# Patient Record
Sex: Female | Born: 1990 | Race: Black or African American | Hispanic: No | Marital: Single | Smoking: Former smoker
Health system: Southern US, Community
[De-identification: ages and names within clinical notes are randomized; demographics above are authoritative.]

## PROBLEM LIST (undated history)

## (undated) ENCOUNTER — Inpatient Hospital Stay (HOSPITAL_COMMUNITY): Payer: Self-pay

## (undated) DIAGNOSIS — R519 Headache, unspecified: Secondary | ICD-10-CM

## (undated) DIAGNOSIS — R4584 Anhedonia: Secondary | ICD-10-CM

## (undated) DIAGNOSIS — J302 Other seasonal allergic rhinitis: Secondary | ICD-10-CM

## (undated) DIAGNOSIS — G40909 Epilepsy, unspecified, not intractable, without status epilepticus: Secondary | ICD-10-CM

## (undated) DIAGNOSIS — F32A Depression, unspecified: Secondary | ICD-10-CM

## (undated) DIAGNOSIS — D649 Anemia, unspecified: Secondary | ICD-10-CM

## (undated) DIAGNOSIS — M419 Scoliosis, unspecified: Secondary | ICD-10-CM

## (undated) HISTORY — PX: OTHER SURGICAL HISTORY: SHX169

---
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
## 2009-01-25 ENCOUNTER — Emergency Department (HOSPITAL_COMMUNITY): Admission: EM | Admit: 2009-01-25 | Discharge: 2009-01-25 | Payer: Self-pay | Admitting: Emergency Medicine

## 2009-02-18 ENCOUNTER — Emergency Department (HOSPITAL_COMMUNITY): Admission: EM | Admit: 2009-02-18 | Discharge: 2009-02-18 | Payer: Self-pay | Admitting: Emergency Medicine

## 2009-06-15 ENCOUNTER — Ambulatory Visit: Payer: Self-pay | Admitting: Pediatrics

## 2009-06-19 ENCOUNTER — Emergency Department (HOSPITAL_COMMUNITY): Admission: EM | Admit: 2009-06-19 | Discharge: 2009-06-19 | Payer: Self-pay | Admitting: Emergency Medicine

## 2009-07-02 ENCOUNTER — Emergency Department (HOSPITAL_COMMUNITY): Admission: EM | Admit: 2009-07-02 | Discharge: 2009-07-02 | Payer: Self-pay | Admitting: Family Medicine

## 2009-08-03 ENCOUNTER — Emergency Department (HOSPITAL_COMMUNITY): Admission: EM | Admit: 2009-08-03 | Discharge: 2009-08-03 | Payer: Self-pay | Admitting: Emergency Medicine

## 2009-08-31 ENCOUNTER — Emergency Department (HOSPITAL_COMMUNITY): Admission: EM | Admit: 2009-08-31 | Discharge: 2009-08-31 | Payer: Self-pay | Admitting: Family Medicine

## 2009-09-15 ENCOUNTER — Ambulatory Visit: Payer: Self-pay | Admitting: Pediatrics

## 2009-11-10 ENCOUNTER — Emergency Department (HOSPITAL_COMMUNITY): Admission: EM | Admit: 2009-11-10 | Discharge: 2009-11-10 | Payer: Self-pay | Admitting: Family Medicine

## 2010-04-18 ENCOUNTER — Ambulatory Visit: Payer: Self-pay | Admitting: Pediatrics

## 2010-07-21 ENCOUNTER — Encounter: Admission: RE | Admit: 2010-07-21 | Discharge: 2010-07-21 | Payer: Self-pay | Admitting: Obstetrics and Gynecology

## 2011-01-21 ENCOUNTER — Encounter: Payer: Self-pay | Admitting: Pediatrics

## 2011-04-06 LAB — POCT RAPID STREP A (OFFICE): Streptococcus, Group A Screen (Direct): NEGATIVE

## 2011-04-07 LAB — URINALYSIS, ROUTINE W REFLEX MICROSCOPIC
Ketones, ur: NEGATIVE mg/dL
Leukocytes, UA: NEGATIVE
Nitrite: NEGATIVE
Protein, ur: 30 mg/dL — AB
pH: 5 (ref 5.0–8.0)

## 2011-04-07 LAB — DIFFERENTIAL
Basophils Absolute: 0 10*3/uL (ref 0.0–0.1)
Lymphocytes Relative: 20 % (ref 12–46)
Neutro Abs: 8 10*3/uL — ABNORMAL HIGH (ref 1.7–7.7)

## 2011-04-07 LAB — BASIC METABOLIC PANEL
BUN: 4 mg/dL — ABNORMAL LOW (ref 6–23)
Calcium: 8.5 mg/dL (ref 8.4–10.5)
GFR calc non Af Amer: >60 mL/min (ref >60)
Glucose, Bld: 95 mg/dL (ref 70–99)
Sodium: 136 mEq/L (ref 135–145)

## 2011-04-07 LAB — CBC
Platelets: 232 10*3/uL (ref 150–400)
RDW: 13.2 % (ref 11.5–15.5)

## 2011-04-08 LAB — POCT RAPID STREP A (OFFICE): Streptococcus, Group A Screen (Direct): NEGATIVE

## 2011-04-09 LAB — DIFFERENTIAL
Basophils Relative: 0 % (ref 0–1)
Lymphocytes Relative: 16 % (ref 12–46)
Lymphs Abs: 1.8 10*3/uL (ref 0.7–4.0)
Monocytes Absolute: 0.7 10*3/uL (ref 0.1–1.0)
Monocytes Relative: 6 % (ref 3–12)
Neutro Abs: 9 10*3/uL — ABNORMAL HIGH (ref 1.7–7.7)

## 2011-04-09 LAB — URINE MICROSCOPIC-ADD ON

## 2011-04-09 LAB — BASIC METABOLIC PANEL
CO2: 26 mEq/L (ref 19–32)
Calcium: 8.5 mg/dL (ref 8.4–10.5)
Chloride: 106 mEq/L (ref 96–112)
GFR calc Af Amer: >60 mL/min (ref >60)
Sodium: 137 mEq/L (ref 135–145)

## 2011-04-09 LAB — CBC
Hemoglobin: 13 g/dL (ref 12.0–15.0)
MCHC: 34 g/dL (ref 30.0–36.0)
RBC: 4.25 MIL/uL (ref 3.87–5.11)
WBC: 11.5 10*3/uL — ABNORMAL HIGH (ref 4.0–10.5)

## 2011-04-09 LAB — URINALYSIS, ROUTINE W REFLEX MICROSCOPIC
Nitrite: NEGATIVE
Specific Gravity, Urine: 1.022 (ref 1.005–1.030)
Urobilinogen, UA: 1 mg/dL (ref 0.0–1.0)

## 2011-04-09 LAB — URINE CULTURE: Colony Count: 70000

## 2011-05-18 NOTE — Procedures (Signed)
EEG NUMBER:  [id number]   CLINICAL HISTORY:  The patient is a 20 year old who has had three episodes  of seizure-like activity, the first at 20 years of age, the second at 71, the  last two weeks ago during sleep.  She had been her tongue bilaterally and  had saliva coming from her mouth.  She has a history of attention deficit  disorder (780.39).   PROCEDURE:  The tracing was carried out on a 32-channel digital Cadwell  recorder reformatted into 16 channel montages with one devoted to EKG.  The  patient was awake and drowsy during the recording.  The International 10-20  system of lead placement used.  Medications include and Naprosyn and  Adderall.   DESCRIPTION OF FINDINGS:  The dominant frequency is an 8-9 Hz, 25-35  microvolt alpha range activity that attenuates partially with eye opening.   Background activity is a mixture of alpha and beta range components with  frontally predominant, under 10 microvolt beta range activity.   The patient becomes drowsy with mixed frequency theta and upper delta range  activity.  Intermittent photic stimulation induced a driving response  between 5 and 15 Hz.  Hyperventilation caused no change.  There was no  interictal epileptiform activity in the form of spikes or sharp waves.   EKG showed regular sinus rhythm with ventricular response of 72 beats per  minute.   IMPRESSION:  Normal record with the patient awake and drowsy.      Karina Nelson. Sharene Skeans, M.D.  Electronically Signed     UKG:URKY  D:  08/23/2006 11:28:05  T:  08/23/2006 14:22:40  Job #:  [id number]

## 2011-05-18 NOTE — Procedures (Signed)
EEG Number [id number].   HISTORY:  This is a 20 year old with history of seizures who is having EEG  done to evaluate for seizure activity.   PROCEDURE:  This is a sleep-deprived EEG.   TECHNICAL DESCRIPTION:  Throughout this sleep-deprived EEG, there is a  posterior dominant rhythm of 10 Hz activity at 20-30 microvolts. Background  activity is symmetric mostly comprised of alpha range activity at 15-35  microvolts. Occasionally in the background there is EMG movement artifact  that occasionally obscures the background. With photic stimulation, there is  symmetric photic driving response.  Hyperventilation does not produce any  significant abnormalities.  The patient does become drowsy and eventually  falls into stage II sleep with the appearance of symmetric vertex waves and  sleep spindles. At one time during the tracing when the patient is drowsy  and sleeping, there is an isolated bilateral frontal sharply contoured slow  wave which is of uncertain significance   IMPRESSION:  This sleep-deprived EEG is essentially within normal limits in  the awake and sleep states. There is an isolated prefrontal sharply  contoured slow wave bilaterally which is of uncertain significance.  We will  consider repeating this study if seizures are of high concern.  Clinical  correlation is advised.   CC: Dr Crista Curb R. Nash Shearer, M.D.  Electronically Signed     FAO:ZHYQ  D:  10/14/2006 09:49:30  T:  10/14/2006 19:00:40  Job #:  [id number]

## 2011-06-05 ENCOUNTER — Ambulatory Visit (HOSPITAL_COMMUNITY)
Admission: RE | Admit: 2011-06-05 | Discharge: 2011-06-05 | Disposition: A | Discharge status: Home / Self Care | Payer: BC Managed Care – PPO | Source: Ambulatory Visit | Attending: Pediatrics | Admitting: Pediatrics

## 2011-06-05 DIAGNOSIS — G40309 Generalized idiopathic epilepsy and epileptic syndromes, not intractable, without status epilepticus: Secondary | ICD-10-CM | POA: Insufficient documentation

## 2011-06-05 NOTE — Procedures (Signed)
EEG NUMBER:  [id number].  CLINICAL HISTORY:  The patient is a 20 year old with seizures that began at age 27.  They recurred at 43-52 years of age.  After treatment with Lamictal, she has been seizure free.  Her last EEG in August 2007, was a normal record with the patient awake and drowsy.  Study is being done to look for presence of seizures (345.10).  PROCEDURE:  Tracing is carried out on a 32-channel digital Cadwell recorder, reformatted into 16-channel montages with one devoted to EKG. The patient was awake and drowsy during the recording.  The international 10/20 system lead placement was used.  She takes Lamictal. Recording time was 23 minutes.  DESCRIPTION OF FINDINGS:  Dominant frequency is a 9 Hz 25-30 microvolt activity that is well-regulated and attenuates partially with eye opening.  Background activity consists of mixed frequency alpha and low- voltage beta range activity.  Toward the end of background shows mixed frequency theta range activity that is broadly distributed.  Light natural sleep was not achieved.  Prior to that activating procedures with intermittent photic stimulation induced a driving response from 7-84 Hz.  Hyperventilation caused no significant change.  There was no interictal epileptiform activity in the form of spikes or sharp waves.  EKG showed a sinus rhythm with ventricular response of 84 beats per minute.  IMPRESSION:  Normal record with the patient awake and drowsy.     Deanna Artis. Sharene Skeans, M.D. Electronically Signed    ONG:EXBM D:  06/05/2011 11:13:19  T:  06/05/2011 23:05:13  Job #:  [id number]

## 2011-09-19 LAB — BASIC METABOLIC PANEL
BUN: 5 — ABNORMAL LOW
CO2: 27
Chloride: 105
Glucose, Bld: 91
Potassium: 4.1

## 2011-09-27 LAB — POCT I-STAT, CHEM 8
BUN: 7
Calcium, Ion: 1.21
Chloride: 105
Creatinine, Ser: 0.9
Glucose, Bld: 82
TCO2: 25

## 2011-09-28 LAB — POCT I-STAT, CHEM 8
BUN: <3 — ABNORMAL LOW
Creatinine, Ser: 0.9
Glucose, Bld: 87
Hemoglobin: 12.6
TCO2: 22

## 2011-10-08 LAB — LEVETIRACETAM LEVEL: Levetiracetam Lvl: 19.6 ug/mL

## 2011-10-11 LAB — COMPREHENSIVE METABOLIC PANEL
AST: 26
Albumin: 3.7
Alkaline Phosphatase: 68
Chloride: 107
Creatinine, Ser: 0.67
Potassium: 4.6
Total Bilirubin: 1.1

## 2011-10-11 LAB — I-STAT 8, (EC8 V) (CONVERTED LAB)
BUN: 4 — ABNORMAL LOW
Chloride: 106
Glucose, Bld: 87
pCO2, Ven: 41 — ABNORMAL LOW
pH, Ven: 7.37 — ABNORMAL HIGH

## 2011-10-11 LAB — DIFFERENTIAL
Basophils Absolute: 0.1
Lymphs Abs: 1.7
Monocytes Relative: 6
Neutro Abs: 4.5

## 2011-10-11 LAB — URINALYSIS, ROUTINE W REFLEX MICROSCOPIC
Bilirubin Urine: NEGATIVE
Glucose, UA: NEGATIVE
Ketones, ur: 15 — AB
Nitrite: NEGATIVE
pH: 5.5

## 2011-10-11 LAB — POCT I-STAT CREATININE
Creatinine, Ser: 0.6
Operator id: 294521

## 2011-10-11 LAB — RAPID URINE DRUG SCREEN, HOSP PERFORMED
Barbiturates: NOT DETECTED
Cocaine: NOT DETECTED
Opiates: NOT DETECTED

## 2011-10-11 LAB — URINE MICROSCOPIC-ADD ON

## 2011-10-11 LAB — CBC
Platelets: 195
WBC: 6.8

## 2011-10-12 LAB — I-STAT 8, (EC8 V) (CONVERTED LAB)
Acid-base deficit: 2
Chloride: 104
HCT: 35 — ABNORMAL LOW
Operator id: 257131
TCO2: 24
pCO2, Ven: 38.9 — ABNORMAL LOW
pH, Ven: 7.373 — ABNORMAL HIGH

## 2011-10-12 LAB — URINALYSIS, ROUTINE W REFLEX MICROSCOPIC
Bilirubin Urine: NEGATIVE
Hgb urine dipstick: NEGATIVE
Nitrite: NEGATIVE
Specific Gravity, Urine: 1.019
Urobilinogen, UA: 0.2
pH: 5.5

## 2011-10-12 LAB — POCT PREGNANCY, URINE
Operator id: 257131
Preg Test, Ur: NEGATIVE

## 2011-10-12 LAB — DIFFERENTIAL
Basophils Absolute: 0
Lymphocytes Relative: 29
Monocytes Absolute: 0.6
Monocytes Relative: 9
Neutro Abs: 3.8
Neutrophils Relative %: 59

## 2011-10-12 LAB — POCT I-STAT CREATININE: Operator id: 257131

## 2011-10-12 LAB — CBC
Hemoglobin: 10.8 — ABNORMAL LOW
RBC: 3.86

## 2011-10-15 LAB — RAPID STREP SCREEN (MED CTR MEBANE ONLY): Streptococcus, Group A Screen (Direct): NEGATIVE

## 2011-11-29 DIAGNOSIS — M79609 Pain in unspecified limb: Secondary | ICD-10-CM | POA: Insufficient documentation

## 2011-11-29 DIAGNOSIS — Z79899 Other long term (current) drug therapy: Secondary | ICD-10-CM | POA: Insufficient documentation

## 2011-11-29 DIAGNOSIS — M7989 Other specified soft tissue disorders: Secondary | ICD-10-CM | POA: Insufficient documentation

## 2011-11-29 DIAGNOSIS — G40909 Epilepsy, unspecified, not intractable, without status epilepticus: Secondary | ICD-10-CM | POA: Insufficient documentation

## 2011-11-29 DIAGNOSIS — J45909 Unspecified asthma, uncomplicated: Secondary | ICD-10-CM | POA: Insufficient documentation

## 2011-11-30 ENCOUNTER — Emergency Department (HOSPITAL_COMMUNITY)
Admission: EM | Admit: 2011-11-30 | Discharge: 2011-11-30 | Disposition: A | Discharge status: Home / Self Care | Payer: BC Managed Care – PPO | Attending: Emergency Medicine | Admitting: Emergency Medicine

## 2011-11-30 ENCOUNTER — Encounter: Payer: Self-pay | Admitting: Emergency Medicine

## 2011-11-30 DIAGNOSIS — M79673 Pain in unspecified foot: Secondary | ICD-10-CM

## 2011-11-30 HISTORY — DX: Other seasonal allergic rhinitis: J30.2

## 2011-11-30 HISTORY — DX: Epilepsy, unspecified, not intractable, without status epilepticus: G40.909

## 2011-11-30 HISTORY — DX: Anemia, unspecified: D64.9

## 2011-11-30 MED ORDER — IBUPROFEN 800 MG PO TABS: 800.0000 mg | ORAL_TABLET | Freq: Three times a day (TID) | ORAL | Status: AC

## 2011-11-30 MED ORDER — HYDROCODONE-ACETAMINOPHEN 5-325 MG PO TABS: ORAL_TABLET | ORAL | Status: DC

## 2011-11-30 NOTE — ED Notes (Signed)
PT. REPORTS BILATERAL FOOT PAIN FOR 3 DAYS , DENIES FALL OR INJURY . AMBULATORY.

## 2011-11-30 NOTE — ED Provider Notes (Signed)
History     CSN: [id number] Arrival date & time: 11/30/2011 12:30 AM   First MD Initiated Contact with Patient 11/30/11 0142      Chief Complaint  Patient presents with  . Foot Pain    (Consider location/radiation/quality/duration/timing/severity/associated sxs/prior treatment) Patient is a 20 y.o. female presenting with lower extremity pain. The history is provided by the patient.  Foot Pain This is a new problem. The current episode started in the past 7 days. The problem occurs daily. The problem has been unchanged. Pertinent negatives include no chills or fever. Associated symptoms comments: Pain in both feet originating at the dorsolateral proximal forefoot and extending into great toe. No injury. Mild swelling. . Exacerbated by: She has been at a new job where she is on her feet all shift for 3 weeks. She has attempted to change shoes without improvement.    Past Medical History  Diagnosis Date  . Asthma   . Epilepsy   . Seasonal allergies   . Anemia     History reviewed. No pertinent past surgical history.  No family history on file.  History  Substance Use Topics  . Smoking status: Never Smoker   . Smokeless tobacco: Not on file  . Alcohol Use: No    OB History    Grav Para Term Preterm Abortions TAB SAB Ect Mult Living                  Review of Systems  Constitutional: Negative for fever and chills.  HENT: Negative.   Respiratory: Negative.   Cardiovascular: Negative.   Gastrointestinal: Negative.   Musculoskeletal:       See HPI.  Skin: Negative.   Neurological: Negative.     Allergies  Review of patient's allergies indicates no known allergies.  Home Medications   Current Outpatient Rx  Name Route Sig Dispense Refill  . LAMOTRIGINE 150 MG PO TABS Oral Take 150 mg by mouth daily.      Marland Kitchen HYDROCODONE-ACETAMINOPHEN 5-325 MG PO TABS  1-2 tabs q 4-6 hours prn 6 tablet 0  . IBUPROFEN 800 MG PO TABS Oral Take 1 tablet (800 mg total) by mouth 3  (three) times daily. 21 tablet 0    BP 122/67  Pulse 86  Temp(Src) 98.5 F (36.9 C) (Oral)  Resp 18  SpO2 98%  LMP 11/15/2011  Physical Exam  Constitutional: She is oriented to person, place, and time. She appears well-developed and well-nourished.  Neck: Normal range of motion.  Pulmonary/Chest: Effort normal.  Musculoskeletal:       Minimal swelling to feet without discoloration, deformity or redness/warmth. FROM. Minimal tenderness. No plantar tenderness.   Neurological: She is alert and oriented to person, place, and time.  Skin: Skin is warm and dry.    ED Course  Procedures (including critical care time)  Labs Reviewed - No data to display No results found.   1. Foot pain       MDM          Rodena Medin, PA 11/30/11 [phone]

## 2011-11-30 NOTE — ED Notes (Signed)
Pt to ED for eval of foot pain; pt reports that she is having pain in bil feet; pt denies injury; to R foot; pt has swelling to lateral side a/w pain; pt c/o sharp pain to L foot

## 2011-11-30 NOTE — ED Provider Notes (Signed)
Medical screening examination/treatment/procedure(s) were performed by non-physician practitioner and as supervising physician I was immediately available for consultation/collaboration.   Lyanne Co, MD 11/30/11 [phone]

## 2011-12-11 ENCOUNTER — Emergency Department (HOSPITAL_COMMUNITY)
Admission: EM | Admit: 2011-12-11 | Discharge: 2011-12-11 | Disposition: A | Discharge status: Home / Self Care | Payer: BC Managed Care – PPO | Attending: Emergency Medicine | Admitting: Emergency Medicine

## 2011-12-11 ENCOUNTER — Encounter (HOSPITAL_COMMUNITY): Payer: Self-pay

## 2011-12-11 DIAGNOSIS — Z9119 Patient's noncompliance with other medical treatment and regimen: Secondary | ICD-10-CM | POA: Insufficient documentation

## 2011-12-11 DIAGNOSIS — R569 Unspecified convulsions: Secondary | ICD-10-CM

## 2011-12-11 DIAGNOSIS — G40909 Epilepsy, unspecified, not intractable, without status epilepticus: Secondary | ICD-10-CM | POA: Insufficient documentation

## 2011-12-11 DIAGNOSIS — Z9114 Patient's other noncompliance with medication regimen: Secondary | ICD-10-CM

## 2011-12-11 DIAGNOSIS — Z91199 Patient's noncompliance with other medical treatment and regimen due to unspecified reason: Secondary | ICD-10-CM | POA: Insufficient documentation

## 2011-12-11 DIAGNOSIS — J45909 Unspecified asthma, uncomplicated: Secondary | ICD-10-CM | POA: Insufficient documentation

## 2011-12-11 LAB — POCT I-STAT, CHEM 8
BUN: 4 mg/dL — ABNORMAL LOW (ref 6–23)
Chloride: 104 mEq/L (ref 96–112)
Creatinine, Ser: 0.8 mg/dL (ref 0.50–1.10)
Glucose, Bld: 103 mg/dL — ABNORMAL HIGH (ref 70–99)
Potassium: 3.6 mEq/L (ref 3.5–5.1)
Sodium: 141 mEq/L (ref 135–145)

## 2011-12-11 MED ORDER — LAMOTRIGINE 200 MG PO TABS
200.0000 mg | ORAL_TABLET | Freq: Once | ORAL | Status: AC
  Administered 2011-12-11: 200 mg via ORAL
  Filled 2011-12-11: qty 1

## 2011-12-11 MED ORDER — LAMOTRIGINE 100 MG PO TABS: ORAL_TABLET | ORAL | Status: DC

## 2011-12-11 MED ORDER — ACETAMINOPHEN 325 MG PO TABS
650.0000 mg | ORAL_TABLET | Freq: Once | ORAL | Status: DC
  Filled 2011-12-11: qty 2

## 2011-12-11 MED ORDER — SODIUM CHLORIDE 0.9 % IV BOLUS (SEPSIS): 500.0000 mL | Freq: Once | INTRAVENOUS | Status: DC

## 2011-12-11 NOTE — ED Provider Notes (Signed)
History     CSN: [id number] Arrival date & time: 12/11/2011 12:14 PM   First MD Initiated Contact with Patient 12/11/11 1219      Chief Complaint  Patient presents with  . Seizures    (Consider location/radiation/quality/duration/timing/severity/associated sxs/prior treatment) Patient is a 20 y.o. female presenting with seizures. The history is provided by the patient.  Seizures  This is a recurrent problem. The current episode started 1 to 2 hours ago. Pertinent negatives include no headaches, no chest pain, no nausea, no vomiting and no diarrhea.   patient had a tonic-clonic seizure today. She's a history of seizures. She's been taking less of her medications because she is about to run out. She also has had a flulike symptoms last week. It has resolved. She also is a new job she states keeps her up at night. She's also had a lot of stress in her life recently. No injury due to the seizure. She's also stopped her birth control pills. She states there were $50 and her insurance is changed.in   Past Medical History  Diagnosis Date  . Asthma   . Epilepsy   . Seasonal allergies   . Anemia     No past surgical history on file.  No family history on file.  History  Substance Use Topics  . Smoking status: Never Smoker   . Smokeless tobacco: Not on file  . Alcohol Use: No    OB History    Grav Para Term Preterm Abortions TAB SAB Ect Mult Living                  Review of Systems  Constitutional: Negative for activity change and appetite change.  HENT: Negative for neck stiffness.   Eyes: Negative for pain.  Respiratory: Negative for chest tightness and shortness of breath.   Cardiovascular: Negative for chest pain and leg swelling.  Gastrointestinal: Negative for nausea, vomiting, abdominal pain and diarrhea.  Genitourinary: Negative for flank pain.  Musculoskeletal: Negative for back pain.  Skin: Negative for rash.  Neurological: Positive for seizures. Negative for  weakness, numbness and headaches.  Psychiatric/Behavioral: Negative for behavioral problems.    Allergies  Celery oil; Rice; Strawberry; and Sudafed  Home Medications   Current Outpatient Rx  Name Route Sig Dispense Refill  . IBUPROFEN 200 MG PO TABS Oral Take 400 mg by mouth every 6 (six) hours as needed. For pain     . LAMOTRIGINE 100 MG PO TABS Oral Take 150-200 mg by mouth as directed. Takes 150mg  in the morning and 200mg  at night     . IBUPROFEN 800 MG PO TABS Oral Take 1 tablet (800 mg total) by mouth 3 (three) times daily. 21 tablet 0  . LAMOTRIGINE 100 MG PO TABS  150 mg in morning and 200 mg at night. 50 tablet 0    BP 119/64  Pulse 107  Temp(Src) 98.6 F (37 C) (Oral)  Resp 16  SpO2 99%  LMP 11/15/2011  Physical Exam  Nursing note and vitals reviewed. Constitutional: She is oriented to person, place, and time. She appears well-developed and well-nourished.       obese  HENT:  Head: Normocephalic and atraumatic.  Eyes: EOM are normal. Pupils are equal, round, and reactive to light.  Neck: Normal range of motion. Neck supple.  Cardiovascular: Normal rate, regular rhythm and normal heart sounds.   No murmur heard. Pulmonary/Chest: Effort normal and breath sounds normal. No respiratory distress. She has no wheezes. She has  no rales.  Abdominal: Soft. Bowel sounds are normal. She exhibits no distension. There is no tenderness. There is no rebound and no guarding.  Musculoskeletal: Normal range of motion.  Neurological: She is alert and oriented to person, place, and time. No cranial nerve deficit.  Skin: Skin is warm and dry.  Psychiatric: She has a normal mood and affect. Her speech is normal.    ED Course  Procedures (including critical care time)  Labs Reviewed  POCT I-STAT, CHEM 8 - Abnormal; Notable for the following:    BUN 4 (*)    Glucose, Bld 103 (*)    All other components within normal limits  POCT PREGNANCY, URINE  I-STAT, CHEM 8  POCT PREGNANCY,  URINE   No results found.   1. Seizure   2. Noncompliance with medications       MDM  Seizure with history of same. She has not been taking all her medications she states she was running out. She has been taking less than her full dose. She has typical tonic-clonic seizure. She is back to baseline. She's not pregnant. She was written for more medication and will follow with her doctors        Juliet Rude. Rubin Payor, MD 12/11/11 1442

## 2011-12-11 NOTE — ED Notes (Signed)
134/84.post-icthal on arrival..The patient has a history of of sz.the patient has cut her med dose.

## 2011-12-11 NOTE — ED Notes (Signed)
Went to inform patient that I need a urine sample and she stated that she had use the bathroom already

## 2011-12-11 NOTE — ED Notes (Signed)
ZOX:[id number]<AV> Expected date:12/11/11<BR> Expected time:11:59 AM<BR> Means of arrival:Ambulance<BR> Comments:<BR> EMS 60 GC, 19 yof seizure w hx

## 2012-02-05 ENCOUNTER — Emergency Department (INDEPENDENT_AMBULATORY_CARE_PROVIDER_SITE_OTHER)
Admission: EM | Admit: 2012-02-05 | Discharge: 2012-02-05 | Disposition: A | Discharge status: Home / Self Care | Payer: BC Managed Care – PPO | Source: Home / Self Care | Attending: Family Medicine | Admitting: Family Medicine

## 2012-02-05 ENCOUNTER — Encounter (HOSPITAL_COMMUNITY): Payer: Self-pay | Admitting: Emergency Medicine

## 2012-02-05 DIAGNOSIS — J069 Acute upper respiratory infection, unspecified: Secondary | ICD-10-CM

## 2012-02-05 DIAGNOSIS — J029 Acute pharyngitis, unspecified: Secondary | ICD-10-CM

## 2012-02-05 MED ORDER — AMOXICILLIN 500 MG PO CAPS: 500.0000 mg | ORAL_CAPSULE | Freq: Three times a day (TID) | ORAL | Status: AC

## 2012-02-05 NOTE — ED Provider Notes (Signed)
History     CSN: [id number]  Arrival date & time 02/05/12  1825   First MD Initiated Contact with Patient 02/05/12 2008      Chief Complaint  Patient presents with  . Sore Throat    (Consider location/radiation/quality/duration/timing/severity/associated sxs/prior treatment) HPI Comments: In the presents for evaluation of sore throat, sneezing, and nasal congestion that started yesterday. She denies any fever, but reports cold, chills and body aches. She's been taking vitamins, especially vitamin C, Zyrtec, and Mucinex without relief. She reports a history of allergies, but no history of asthma. She reports that the sore throat is the worst symptom and she has difficulty swallowing.  Patient is a 21 y.o. female presenting with pharyngitis. The history is provided by the patient.  Sore Throat This is a new problem. The problem occurs constantly. The problem has not changed since onset.The symptoms are aggravated by swallowing and eating. She has tried nothing for the symptoms.    Past Medical History  Diagnosis Date  . Asthma   . Epilepsy   . Seasonal allergies   . Anemia     No past surgical history on file.  No family history on file.  History  Substance Use Topics  . Smoking status: Never Smoker   . Smokeless tobacco: Not on file  . Alcohol Use: No    OB History    Grav Para Term Preterm Abortions TAB SAB Ect Mult Living                  Review of Systems  Constitutional: Positive for chills.  HENT: Positive for congestion and sore throat.   Eyes: Negative.   Respiratory: Negative.  Negative for cough.   Cardiovascular: Negative.   Gastrointestinal: Negative.   Genitourinary: Negative.   Musculoskeletal: Negative.   Skin: Negative.   Neurological: Negative.     Allergies  Celery oil; Rice; Strawberry; and Sudafed  Home Medications   Current Outpatient Rx  Name Route Sig Dispense Refill  . CETIRIZINE HCL 10 MG PO TABS Oral Take 10 mg by mouth daily.     . AMOXICILLIN 500 MG PO CAPS Oral Take 1 capsule (500 mg total) by mouth 3 (three) times daily. 30 capsule 0  . IBUPROFEN 200 MG PO TABS Oral Take 400 mg by mouth every 6 (six) hours as needed. For pain     . LAMOTRIGINE 100 MG PO TABS Oral Take 150-200 mg by mouth as directed. Takes 150mg  in the morning and 200mg  at night     . LAMOTRIGINE 100 MG PO TABS  150 mg in morning and 200 mg at night. 50 tablet 0    BP 126/83  Pulse 86  Temp(Src) 98.2 F (36.8 C) (Oral)  Resp 17  SpO2 98%  Physical Exam  Nursing note and vitals reviewed. Constitutional: She is oriented to person, place, and time. She appears well-developed and well-nourished.  HENT:  Head: Normocephalic and atraumatic.  Right Ear: Tympanic membrane normal.  Left Ear: Tympanic membrane normal.  Mouth/Throat: No oropharyngeal exudate.    Eyes: EOM are normal.  Neck: Normal range of motion.  Pulmonary/Chest: Effort normal and breath sounds normal. She has no wheezes. She has no rhonchi.  Musculoskeletal: Normal range of motion.  Neurological: She is alert and oriented to person, place, and time.  Skin: Skin is warm and dry.  Psychiatric: Her behavior is normal.    ED Course  Procedures (including critical care time)  Labs Reviewed - No data to  display No results found.   1. URI (upper respiratory infection)   2. Pharyngitis       MDM  Likely viral pharyngitis; given delayed rx for amoxicillin        Richardo Priest, MD 02/05/12 2048

## 2012-02-05 NOTE — ED Notes (Signed)
Pt. Stated, I've had a sore throat and achiness since yesterday.

## 2013-04-17 ENCOUNTER — Other Ambulatory Visit: Payer: Self-pay

## 2013-04-17 DIAGNOSIS — G40309 Generalized idiopathic epilepsy and epileptic syndromes, not intractable, without status epilepticus: Secondary | ICD-10-CM

## 2013-04-17 MED ORDER — LAMOTRIGINE 100 MG PO TABS: ORAL_TABLET | ORAL | Status: DC

## 2013-05-15 ENCOUNTER — Other Ambulatory Visit: Payer: Self-pay

## 2013-05-15 DIAGNOSIS — G40309 Generalized idiopathic epilepsy and epileptic syndromes, not intractable, without status epilepticus: Secondary | ICD-10-CM

## 2013-05-15 MED ORDER — LAMOTRIGINE 100 MG PO TABS: ORAL_TABLET | ORAL | Status: DC

## 2013-05-15 NOTE — Telephone Encounter (Signed)
Beverly lvm stating that pt needed a refill and also wanted to schedule f/u visit. I called mom back and reached her vm. I lvm letting her know that i was calling to schedule appt and that we would refill the medication this once but that she needed to call back for appt.

## 2013-06-19 ENCOUNTER — Other Ambulatory Visit: Payer: Self-pay

## 2013-06-19 DIAGNOSIS — G40309 Generalized idiopathic epilepsy and epileptic syndromes, not intractable, without status epilepticus: Secondary | ICD-10-CM

## 2013-06-19 MED ORDER — LAMOTRIGINE 100 MG PO TABS: ORAL_TABLET | ORAL | Status: DC

## 2013-06-19 NOTE — Telephone Encounter (Signed)
[place] asked that refills be sent to [facility]. I called and let her know it will be done today.

## 2013-07-08 ENCOUNTER — Encounter: Payer: Self-pay | Admitting: Family

## 2013-07-08 DIAGNOSIS — G43009 Migraine without aura, not intractable, without status migrainosus: Secondary | ICD-10-CM

## 2013-07-08 DIAGNOSIS — F909 Attention-deficit hyperactivity disorder, unspecified type: Secondary | ICD-10-CM | POA: Insufficient documentation

## 2013-07-08 DIAGNOSIS — R209 Unspecified disturbances of skin sensation: Secondary | ICD-10-CM

## 2013-07-08 DIAGNOSIS — Z79899 Other long term (current) drug therapy: Secondary | ICD-10-CM | POA: Insufficient documentation

## 2013-07-08 DIAGNOSIS — G40309 Generalized idiopathic epilepsy and epileptic syndromes, not intractable, without status epilepticus: Secondary | ICD-10-CM

## 2013-07-09 ENCOUNTER — Encounter: Payer: Self-pay | Admitting: Family

## 2013-07-09 ENCOUNTER — Ambulatory Visit (INDEPENDENT_AMBULATORY_CARE_PROVIDER_SITE_OTHER): Payer: BC Managed Care – PPO | Admitting: Family

## 2013-07-09 VITALS — BP 126/82 | HR 80 | Ht 64.25 in | Wt 235.2 lb

## 2013-07-09 DIAGNOSIS — G43009 Migraine without aura, not intractable, without status migrainosus: Secondary | ICD-10-CM

## 2013-07-09 DIAGNOSIS — Z79899 Other long term (current) drug therapy: Secondary | ICD-10-CM

## 2013-07-09 DIAGNOSIS — F909 Attention-deficit hyperactivity disorder, unspecified type: Secondary | ICD-10-CM

## 2013-07-09 DIAGNOSIS — G40309 Generalized idiopathic epilepsy and epileptic syndromes, not intractable, without status epilepticus: Secondary | ICD-10-CM

## 2013-07-09 DIAGNOSIS — R209 Unspecified disturbances of skin sensation: Secondary | ICD-10-CM

## 2013-07-09 NOTE — Progress Notes (Signed)
Patient: Karina Nelson MRN: [id number] Sex: female DOB: [birth year]  Provider: Elveria Rising, NP Location of Care: [facility]  Note type: Routine return visit  History of Present Illness: Referral Source: Fleet Contras, MD History from: patient Chief Complaint: Seizures  Karina Nelson is a 22 y.o. 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  She is taking and tolerating Lamictal for her seizure disorder, which she obtains from a patient assistance program. Her last seizure occurred December 11, 2011 in the setting of missed medication and sleep deprivation. 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 She says that she doesn't like her academic major and is looking into going to the police academy. Karina has history of migraine headaches but she says that they have not been problematic. She has been healthy since last seen.   Review of Systems: 12 system review was remarkable for allergies  Past Medical History  Diagnosis Date  . Asthma   . Epilepsy   . Seasonal allergies   . Anemia    Hospitalizations: no, Head Injury: no, Nervous System Infections: no, Immunizations up to date: yes Past Medical History Comments: Significant for seizures.  She had a sleep study at [facility] in 2009.  The study was normal and was similar to that of 2004 when it was concluded that she had normal sleep architecture and had more of a problem with sleep hygiene.  It did not show any evidence of seizures.  Surgical History Surgeries: yes Surgical History Comments: Cyst removed behind left ear 10 years ago.  Family History There is epilepsy, diabetes, high blood pressure, heart disease, cancer and hemophilia in her family history.  Family History is negative for migraines, cognitive impairment, blindness, deafness, birth defects, chromosomal disorder, autism.  Social  History History   Social History  . Marital Status: Single    Spouse Name: N/A    Number of Children: N/A  . Years of Education: N/A   Social History Main Topics  . Smoking status: Never Smoker   . Smokeless tobacco: None  . Alcohol Use: Yes     Comment: Patient drinks alcohol occasionally on the weekend only.  . Drug Use: No  . Sexually Active: None   Other Topics Concern  . None   Social History Narrative  . None   Educational level: in college by taking some time off  Occupation: [employer] with mother and younger brother.   Current Outpatient Prescriptions on File Prior to Visit  Medication Sig Dispense Refill  . cetirizine (ZYRTEC) 10 MG tablet Take 10 mg by mouth daily.      Marland Kitchen ibuprofen (ADVIL,MOTRIN) 200 MG tablet Take 400 mg by mouth every 6 (six) hours as needed. For pain       . lamoTRIgine (LAMICTAL) 100 MG tablet Take 1+1/2 in the morning and take 2 at night  105 tablet  0   No current facility-administered medications on file prior to visit.   The medication list was reviewed and reconciled. All changes or newly prescribed medications were explained.  A complete medication list was provided to the patient/caregiver.  Allergies  Allergen Reactions  . Celery Oil Anaphylaxis  . Rice Anaphylaxis  . Strawberry Anaphylaxis  . Sudafed (Pseudoephedrine) Other (See Comments)    dizziness    Physical Exam BP 126/82  Pulse 80  Ht 5' 4.25" (1.632 m)  Wt 235 lb  3.2 oz (106.686 kg)  BMI 40.06 kg/m2 General: well developed, well nourished obese young woman, seated on exam table, in no evident distress Head: head  normocephalic and atraumatic.  Oropharynx benign. Ears, Nose and Throat: Oropharynx benign. Neck: supple with no carotid or supraclavicular bruits. Cardiovascular: regular rate and rhythm, no murmurs Musculoskeletal: no obvious deformities or scoliosis Skin: no rashes or lesions  Neurologic Exam  Mental Status: Awake and fully alert.   Oriented to place and time.  Recent and remote memory intact.  Attention span, concentration, and fund of knowledge appropriate.  Mood and affect appropriate. Cranial Nerves: Fundoscopic exam reveals sharp disc margins.  Pupils equal, briskly reactive to light.  Extraocular movements full without nystagmus.  Visual fields full to confrontation.  Hearing intact and symmetric to finger rub.  Facial sensation intact.  Face, tongue, palate move normally and symmetrically.  Neck flexion and extension normal. Motor: Normal bulk and tone.  Normal strength in all tested extremity muscles. Sensory: Intact to touch and temperature in all extremities. Coordination: Rapid alternating movements normal in all extremities.  Finger-to-nose and heel-to-shin performed accurately bilaterally. Romberg negative. Gait and Station: Arises from chair without difficulty.  Stance is normal.  Gait demonstrates normal stride length and balance.  Able to heel, toe, and tandem walk without difficulty. Reflexes: diminished and symmetric.  Toes downgoing.   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 She is taking and tolerating Lamictal and has been seizure free since December 2012. She will continue her medication without change. If Karina has not returned to school at her next follow up, I will help her to transition to adult neurology care.

## 2013-07-14 ENCOUNTER — Encounter: Payer: Self-pay | Admitting: Family

## 2013-07-14 NOTE — Patient Instructions (Signed)
Continue your medication without change.  Let me know if you have any seizures.  Plan to return for follow up in 6 months or sooner if needed.

## 2013-07-22 ENCOUNTER — Other Ambulatory Visit: Payer: Self-pay

## 2013-07-22 DIAGNOSIS — G40309 Generalized idiopathic epilepsy and epileptic syndromes, not intractable, without status epilepticus: Secondary | ICD-10-CM

## 2013-07-22 MED ORDER — LAMOTRIGINE 100 MG PO TABS: ORAL_TABLET | ORAL | Status: DC

## 2013-10-13 ENCOUNTER — Encounter (HOSPITAL_COMMUNITY): Payer: Self-pay | Admitting: Emergency Medicine

## 2013-10-13 ENCOUNTER — Emergency Department (HOSPITAL_COMMUNITY)
Admission: EM | Admit: 2013-10-13 | Discharge: 2013-10-13 | Disposition: A | Discharge status: Home / Self Care | Payer: BC Managed Care – PPO | Attending: Emergency Medicine | Admitting: Emergency Medicine

## 2013-10-13 DIAGNOSIS — J309 Allergic rhinitis, unspecified: Secondary | ICD-10-CM | POA: Insufficient documentation

## 2013-10-13 DIAGNOSIS — Z888 Allergy status to other drugs, medicaments and biological substances status: Secondary | ICD-10-CM | POA: Insufficient documentation

## 2013-10-13 DIAGNOSIS — J45909 Unspecified asthma, uncomplicated: Secondary | ICD-10-CM | POA: Insufficient documentation

## 2013-10-13 DIAGNOSIS — Z79899 Other long term (current) drug therapy: Secondary | ICD-10-CM | POA: Insufficient documentation

## 2013-10-13 DIAGNOSIS — J020 Streptococcal pharyngitis: Secondary | ICD-10-CM | POA: Insufficient documentation

## 2013-10-13 DIAGNOSIS — G40909 Epilepsy, unspecified, not intractable, without status epilepticus: Secondary | ICD-10-CM | POA: Insufficient documentation

## 2013-10-13 DIAGNOSIS — R1310 Dysphagia, unspecified: Secondary | ICD-10-CM | POA: Insufficient documentation

## 2013-10-13 DIAGNOSIS — D649 Anemia, unspecified: Secondary | ICD-10-CM | POA: Insufficient documentation

## 2013-10-13 MED ORDER — IBUPROFEN 800 MG PO TABS: 800.0000 mg | ORAL_TABLET | Freq: Three times a day (TID) | ORAL | Status: DC | PRN

## 2013-10-13 MED ORDER — PENICILLIN G BENZATHINE 1200000 UNIT/2ML IM SUSP
1.2000 10*6.[IU] | Freq: Once | INTRAMUSCULAR | Status: AC
  Administered 2013-10-13: 1.2 10*6.[IU] via INTRAMUSCULAR
  Filled 2013-10-13: qty 2

## 2013-10-13 MED ORDER — ACETAMINOPHEN-CODEINE 120-12 MG/5ML PO SOLN: 10.0000 mL | ORAL | Status: DC | PRN

## 2013-10-13 NOTE — ED Provider Notes (Signed)
CSN: [id number]     Arrival date & time 10/13/13  1044 History  This chart was scribed for non-physician practitioner, Ebbie Ridge, PA-C, working with Laray Anger, DO by Ronal Fear, ED scribe. This patient was seen in room TR11C/TR11C and the patient's care was started at 11:32 AM.     Chief Complaint  Patient presents with  . Sore Throat  . Allergic Reaction   Patient is a 22 y.o. female presenting with pharyngitis and allergic reaction. The history is provided by the patient. No language interpreter was used.  Sore Throat This is a new problem. The current episode started yesterday.  Allergic Reaction Presenting symptoms: difficulty swallowing   Severity:  Mild Relieved by:  None tried   Pt complains of gradual worsening sore throat and cough onset last night. Pt denies ear pain and rhinorrhea. Pt has a hx of seizures.  Past Medical History  Diagnosis Date  . Asthma   . Epilepsy   . Seasonal allergies   . Anemia    History reviewed. No pertinent past surgical history. History reviewed. No pertinent family history. History  Substance Use Topics  . Smoking status: Never Smoker   . Smokeless tobacco: Not on file  . Alcohol Use: Yes     Comment: Patient drinks alcohol occasionally on the weekend only.   OB History   Grav Para Term Preterm Abortions TAB SAB Ect Mult Living                 Review of Systems  HENT: Positive for sore throat and trouble swallowing. Negative for ear pain and rhinorrhea.   All other systems reviewed and are negative.    Allergies  Celery oil; Rice; Strawberry; and Sudafed  Home Medications   Current Outpatient Rx  Name  Route  Sig  Dispense  Refill  . cetirizine (ZYRTEC) 10 MG tablet   Oral   Take 10 mg by mouth daily.         Marland Kitchen ibuprofen (ADVIL,MOTRIN) 200 MG tablet   Oral   Take 400 mg by mouth every 6 (six) hours as needed. For pain          . lamoTRIgine (LAMICTAL) 100 MG tablet      Take 1+1/2 in the morning and  take 2 at night   105 tablet   5    BP 120/75  Pulse 90  Temp(Src) 98.3 F (36.8 C) (Oral)  Resp 18  Ht 5\' 4"  (1.626 m)  Wt 240 lb (108.863 kg)  BMI 41.18 kg/m2  SpO2 100% Physical Exam  Nursing note and vitals reviewed. Constitutional: She is oriented to person, place, and time. She appears well-developed and well-nourished. No distress.  HENT:  Head: Normocephalic and atraumatic.  Mouth/Throat: Posterior oropharyngeal erythema present.  Eyes: EOM are normal.  Neck: Neck supple. No tracheal deviation present.  Cardiovascular: Normal rate.   Pulmonary/Chest: Effort normal. No respiratory distress.  Musculoskeletal: Normal range of motion.  Neurological: She is alert and oriented to person, place, and time.  Skin: Skin is warm and dry.  Psychiatric: She has a normal mood and affect. Her behavior is normal.    ED Course  Procedures (including critical care time) DIAGNOSTIC STUDIES: Oxygen Saturation is 100% on RA, normal by my interpretation.    COORDINATION OF CARE:    11:34 AM- Pt advised of plan for treatment including waiting for the strep test results and pt agrees.   Labs Review Labs Reviewed  RAPID STREP  SCREEN - Abnormal; Notable for the following:    Streptococcus, Group A Screen (Direct) POSITIVE (*)    All other components within normal limits    I personally performed the services described in this documentation, which was scribed in my presence. The recorded information has been reviewed and is accurate.   Patient be treated for strep pharyngitis.  Told to return here as needed.  Told to increase her fluid intake.  Advised followup with primary care Dr.   Carlyle Dolly, PA-C 10/13/13 1229

## 2013-10-13 NOTE — ED Notes (Addendum)
C/o sore throat since yesterday. Tonsils enlarged with exudate. Pt denies allergic reaction.

## 2013-10-13 NOTE — ED Notes (Signed)
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

## 2013-10-13 NOTE — Discharge Instructions (Signed)
Return here as needed.  Increase her fluid intake.  Followup with primary care Dr.. rest as much as possible

## 2013-10-14 NOTE — ED Provider Notes (Signed)
Medical screening examination/treatment/procedure(s) were performed by non-physician practitioner and as supervising physician I was immediately available for consultation/collaboration.   Margery Szostak M Marvon Shillingburg, DO 10/14/13 2212 

## 2014-01-22 ENCOUNTER — Other Ambulatory Visit: Payer: Self-pay

## 2014-01-22 DIAGNOSIS — G40309 Generalized idiopathic epilepsy and epileptic syndromes, not intractable, without status epilepticus: Secondary | ICD-10-CM

## 2014-01-22 MED ORDER — LAMICTAL 100 MG PO TABS: ORAL_TABLET | ORAL | Status: DC

## 2014-01-22 MED ORDER — LAMOTRIGINE 100 MG PO TABS: ORAL_TABLET | ORAL | Status: DC

## 2014-01-22 NOTE — Addendum Note (Signed)
Addended by: Princella IonGOODPASTURE, Chandan Fly P on: 01/22/2014 10:35 AM   Modules accepted: Orders

## 2014-01-22 NOTE — Telephone Encounter (Signed)
I reviewed refill request and realized that when I last saw the patient, she told me that she was taking brand Lamictal from patient assistance program, and that this refill request was for generic Lamotrigine and for 14 day supply. I called pharmacy, who said that she had been purchasing 2 week supplies because of lack of insurance. I refilled the generic Lamotrigine as requested and sent a message to scheduler to call patient for follow up appointment. TG

## 2014-02-10 ENCOUNTER — Other Ambulatory Visit: Payer: Self-pay | Admitting: Family

## 2014-02-10 ENCOUNTER — Other Ambulatory Visit: Payer: Self-pay

## 2014-02-10 DIAGNOSIS — G40309 Generalized idiopathic epilepsy and epileptic syndromes, not intractable, without status epilepticus: Secondary | ICD-10-CM

## 2014-02-10 MED ORDER — LAMOTRIGINE 100 MG PO TABS: ORAL_TABLET | ORAL | Status: DC

## 2014-02-17 ENCOUNTER — Ambulatory Visit: Payer: BC Managed Care – PPO | Admitting: Family

## 2014-03-09 ENCOUNTER — Ambulatory Visit (INDEPENDENT_AMBULATORY_CARE_PROVIDER_SITE_OTHER): Payer: BC Managed Care – PPO | Admitting: Family

## 2014-03-09 ENCOUNTER — Encounter: Payer: Self-pay | Admitting: Family

## 2014-03-09 VITALS — BP 110/74 | HR 90 | Ht 64.5 in | Wt 231.6 lb

## 2014-03-09 DIAGNOSIS — G4700 Insomnia, unspecified: Secondary | ICD-10-CM

## 2014-03-09 DIAGNOSIS — Z79899 Other long term (current) drug therapy: Secondary | ICD-10-CM

## 2014-03-09 DIAGNOSIS — F909 Attention-deficit hyperactivity disorder, unspecified type: Secondary | ICD-10-CM

## 2014-03-09 DIAGNOSIS — G40309 Generalized idiopathic epilepsy and epileptic syndromes, not intractable, without status epilepticus: Secondary | ICD-10-CM

## 2014-03-09 DIAGNOSIS — G43009 Migraine without aura, not intractable, without status migrainosus: Secondary | ICD-10-CM

## 2014-03-09 MED ORDER — LAMOTRIGINE 100 MG PO TABS: ORAL_TABLET | ORAL | Status: DC

## 2014-03-09 NOTE — Patient Instructions (Addendum)
Continue Lamotrigine at same dose. Let me know if you have any seizures.  Work on getting more sleep as we discussed today. Remember that you have developed the sleep pattern that you have now and that it will take time to change that pattern.  Work on losing more weight. You have lost 4 lbs since your last visit and have done a good job of not gaining weight back since 2012, but I would like to see you lose more weight and get into a normal BMI range.  Please plan to return for follow up in 6 months or sooner if needed.

## 2014-03-09 NOTE — Progress Notes (Signed)
Patient: Karina Nelson MRN: [id number] Sex: female DOB: June 08, 1991  Provider: Elveria Rising, NP Location of Care: [facility]  Note type: Routine return visit  History of Present Illness: Referral Source: Dr. Evelina Bucy History from: patient Chief Complaint: Seizures  Karina Nelson is a 23 y.o. 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 She is taking and tolerating Lamictal for her seizure disorder. Her last seizure occurred December 11, 2011 in the setting of missed medication and sleep deprivation. 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 She says that she doesn't like her academic major and is looking into going to the police academy. Karina has trouble with insomnia had has found that working 2nd shift has worked well for her, but she still tends to sleep very little. She has been healthy since last seen.   Review of Systems: 12 system review was remarkable for not enough sleep  Past Medical History  Diagnosis Date  . Asthma   . Epilepsy   . Seasonal allergies   . Anemia    Hospitalizations: no, Head Injury: no, Nervous System Infections: no, Immunizations up to date: yes Past Medical History Comments: Significant for seizures. She had a sleep study at [facility] in 2009. The study was normal and was similar to that of 2004 when it was concluded that she had normal sleep architecture and had more of a problem with sleep hygiene. It did not show any evidence of seizures.  Surgical History History reviewed. No pertinent past surgical history.  Family History There is epilepsy, diabetes, high blood pressure, heart disease, cancer and hemophilia in her family history. Family History is otherwise negative for migraines, seizures, cognitive impairment, blindness, deafness, birth defects, chromosomal disorder, autism.  Social History History    Social History  . Marital Status: Single    Spouse Name: N/A    Number of Children: N/A  . Years of Education: N/A   Social History Main Topics  . Smoking status: Never Smoker   . Smokeless tobacco: Never Used  . Alcohol Use: Yes     Comment: Patient drinks alcohol occasionally on the weekend only.  . Drug Use: No  . Sexual Activity: Yes   Other Topics Concern  . None   Social History Narrative  . None   Educational level: 12th grade School Attending:N/A Living with:  Mother and brother  Hobbies/Interest: Watching TV, texting on cell phone and social media School comments:  Karina graduated from [employer] in 2009. 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  Physical Exam BP 110/74  Pulse 90  Ht 5' 4.5" (1.638 m)  Wt 231 lb 9.6 oz (105.053 kg)  BMI 39.15 kg/m2  LMP 02/21/2014 General: well developed, well nourished obese young woman, seated on exam table, in no evident distress  Head: head normocephalic and atraumatic. Oropharynx benign.  Ears, Nose and Throat: Oropharynx benign.  Neck: supple with no carotid or supraclavicular bruits.  Cardiovascular: regular rate and rhythm, no murmurs  Musculoskeletal: no obvious deformities or scoliosis  Skin: no rashes or lesions   Neurologic Exam  Mental Status: Awake and fully alert. Oriented to place and time. Recent and remote memory intact. Attention span, concentration, and fund of knowledge appropriate. Mood and affect appropriate.  Cranial Nerves: Fundoscopic exam reveals sharp disc margins. Pupils equal, briskly reactive to  light. Extraocular movements full without nystagmus. Visual fields full to confrontation. Hearing intact and symmetric to finger rub. Facial sensation intact. Face, tongue, palate move normally and symmetrically. Neck flexion and extension normal.  Motor: Normal bulk and tone. Normal strength in all tested extremity muscles.  Sensory: Intact  to touch and temperature in all extremities.  Coordination: Rapid alternating movements normal in all extremities. Finger-to-nose and heel-to-shin performed accurately bilaterally. Romberg negative.  Gait and Station: Arises from chair without difficulty. Stance is normal. Gait demonstrates normal stride length and balance. Able to heel, toe, and tandem walk without difficulty.  Reflexes: diminished and symmetric. Toes downgoing.  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 She is taking and tolerating Lamictal and has been seizure free since December 2012. She will continue her medication without change and will return for follow up in 6 months or sooner if needed.

## 2014-09-20 ENCOUNTER — Encounter: Payer: Self-pay | Admitting: Family

## 2014-09-20 ENCOUNTER — Ambulatory Visit (INDEPENDENT_AMBULATORY_CARE_PROVIDER_SITE_OTHER): Payer: BC Managed Care – PPO | Admitting: Family

## 2014-09-20 VITALS — BP 118/82 | HR 86 | Ht 65.0 in | Wt 236.2 lb

## 2014-09-20 DIAGNOSIS — Z79899 Other long term (current) drug therapy: Secondary | ICD-10-CM

## 2014-09-20 DIAGNOSIS — G4700 Insomnia, unspecified: Secondary | ICD-10-CM

## 2014-09-20 DIAGNOSIS — G40309 Generalized idiopathic epilepsy and epileptic syndromes, not intractable, without status epilepticus: Secondary | ICD-10-CM

## 2014-09-20 DIAGNOSIS — R040 Epistaxis: Secondary | ICD-10-CM

## 2014-09-20 MED ORDER — LAMOTRIGINE 100 MG PO TABS: ORAL_TABLET | ORAL | Status: DC

## 2014-09-20 NOTE — Progress Notes (Signed)
Patient: Karina Nelson MRN: [id number] Sex: female DOB: 1991-04-15  Provider: Elveria Rising, NP Location of Care: [facility]  Note type: Routine return visit  History of Present Illness: Referral Source: Dr.Edwin Cathlean [place] History from: patient Chief Complaint: Seizures  Karina Nelson is a 24 y.o. 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 Karina was last seen March 09, 2014. She is taking and tolerating Lamictal for her seizure disorder. Her last convulsive seizure occurred December 11, 2011 in the setting of missed medication and sleep deprivation. Karina tells me today that she had what she thinks may have been a seizure aura last week. She said that felt her heart racing and felt like her arm was trembling. She had no other symptoms. She rested, then walked around a few minutes and the feeling passed. She said that she had the same feeling before when she had a convulsive seizure. She denies any missed medication. She has been working second shift and has some trouble sleeping when she gets home from work. She also has to get up the next morning to call her employer to see if work is available to her for that day as she works per diem status, so she says that she always feels tired.   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 Now she says that she has been downsized to 2nd shift on per diem status. She is worried about that and has been thinking about returning to school versus finding a new job. She has been healthy since last seen other than some nosebleeds.   Review of Systems: 12 system review was remarkable for anxiety and can't sleep  Past Medical History  Diagnosis Date  . Asthma   . Epilepsy   . Seasonal allergies   . Anemia    Hospitalizations: No., Head Injury: No., Nervous System Infections: No.,  Immunizations up to date: Yes.   Past Medical History Comments:Significant for seizures. She had a sleep study at [facility] in 2009. The study was normal and was similar to that of 2004 when it was concluded that she had normal sleep architecture and had more of a problem with sleep hygiene. It did not show any evidence of seizures.  Surgical History History reviewed. No pertinent past surgical history.  Family History family history is not on file. Family History is otherwise negative for migraines, seizures, cognitive impairment, blindness, deafness, birth defects, chromosomal disorder, autism.  Social History History   Social History  . Marital Status: Single    Spouse Name: N/A    Number of Children: N/A  . Years of Education: N/A   Social History Main Topics  . Smoking status: Current Every Day Smoker    Types: Cigars  . Smokeless tobacco: Never Used     Comment: 1-2 cigars a day  . Alcohol Use: Yes     Comment: Patient drinks alcohol occasionally on the weekend only.  . Drug Use: No  . Sexual Activity: Yes   Other Topics Concern  . None   Social History Narrative  . None   Educational level: 12th grade School Attending:N/A Living with:  mother  Hobbies/Interest: working School comments:  Karina graduated from [employer] in 2009. She is currently working for Youth worker as a Company secretary.  Physical Exam BP 118/82  Pulse 86  Ht  (1.651 m)  Wt 236 lb 3.2 oz (107.14 kg)  BMI 39.31 kg/m2  LMP 08/22/2014 General: well developed, well nourished obese young woman, seated on exam table, in no evident distress  Head: head normocephalic and atraumatic. Oropharynx benign.  Ears, Nose and Throat: Oropharynx benign.  Neck: supple with no carotid or supraclavicular bruits.  Cardiovascular: regular rate and rhythm, no murmurs  Musculoskeletal: no obvious deformities or scoliosis  Skin: no rashes or lesions   Neurologic Exam  Mental Status:  Awake and fully alert. Oriented to place and time. Recent and remote memory intact. Attention span, concentration, and fund of knowledge appropriate. Mood and affect appropriate.  Cranial Nerves: Fundoscopic exam reveals sharp disc margins. Pupils equal, briskly reactive to light. Extraocular movements full without nystagmus. Visual fields full to confrontation. Hearing intact and symmetric to finger rub. Facial sensation intact. Face, tongue, palate move normally and symmetrically. Neck flexion and extension normal.  Motor: Normal bulk and tone. Normal strength in all tested extremity muscles.  Sensory: Intact to touch and temperature in all extremities.  Coordination: Rapid alternating movements normal in all extremities. Finger-to-nose and heel-to-shin performed accurately bilaterally. Romberg negative.  Gait and Station: Arises from chair without difficulty. Stance is normal. Gait demonstrates normal stride length and balance. Able to heel, toe, and tandem walk without difficulty.  Reflexes: diminished and symmetric. Toes downgoing.   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 She is taking and tolerating Lamictal. She has not had a convulsive seizure since December 2012 but had a seizure aura last week. I told Karina that she needs to have her Lamotrigine level checked today as she has not taken her medication yet. I gave her a blood test order and instructions for having the blood drawn. I will call her when I obtain the results. I also talked with her about getting more sleep as that is likely contributing to this problem. Karina continues to be obese and I reminded her of the need for healthier food intake and weight loss. I encouraged her to follow up with her PCP about that as I am concerned that she will develop chronic medical problems such as diabetes. For her nose bleeds, I told her  that she could try some OTC nasal spray such as simply saline to moisturize the nasal passages but should follow up with her PCP if the nosebleeds continue or if she has any other concerns. She will otherwise continue her medication without change and will return for follow up in 6 months or sooner if needed.   Wt Readings from Last 3 Encounters:  09/20/14 236 lb 3.2 oz (107.14 kg)  03/09/14 231 lb 9.6 oz (105.053 kg)  10/13/13 240 lb (108.863 kg)

## 2014-09-20 NOTE — Patient Instructions (Signed)
I have given you a blood test order. Please have your blood drawn before you take medication today. I will call you when I receive the results to let you know if the Lamotrigine dose needs to be adjusted. This blood test usually takes 4-5 days to process.   For your problems sleeping, remember to limit caffeine intake after 6pm each day, and try to get to bed as soon as you can when you get home from work.   For your nosebleeds, you could try something like Simply Saline Nasal Mist. This is a non-medicine mist nasal spray that helps to moisturize the tissues inside your nose. Many times nose bleeds come from dry nasal passages. If the nosebleeds continue, please see your PCP.   Please plan to return for follow up in 6 months or sooner if needed.

## 2014-09-21 ENCOUNTER — Encounter: Payer: Self-pay | Admitting: Family

## 2014-09-21 DIAGNOSIS — R040 Epistaxis: Secondary | ICD-10-CM | POA: Insufficient documentation

## 2014-09-22 ENCOUNTER — Telehealth: Payer: Self-pay | Admitting: Family

## 2014-09-22 DIAGNOSIS — G40309 Generalized idiopathic epilepsy and epileptic syndromes, not intractable, without status epilepticus: Secondary | ICD-10-CM

## 2014-09-22 LAB — LAMOTRIGINE LEVEL: LAMOTRIGINE LVL: 5.3 ug/mL (ref 4.0–18.0)

## 2014-09-22 MED ORDER — LAMOTRIGINE 100 MG PO TABS: ORAL_TABLET | ORAL | Status: DC

## 2014-09-22 NOTE — Telephone Encounter (Signed)
I called instructions to [place] and updated her Rx at pharmacy. She said that she had another aura yesterday. I told her to let me know if she continued to have them after increasing the dose. TG

## 2014-09-22 NOTE — Telephone Encounter (Signed)
Message copied by Princella Ion on Wed Sep 22, 2014  2:19 PM ------      Message from: Deetta Perla      Created: Wed Sep 22, 2014  2:07 PM      Regarding: RE: Lab results       I would recommend increasing lamotrigine to 100 mg 2 twice daily.  Thank you      ----- Message -----         From: Elveria Rising, NP         Sent: 09/22/2014   9:07 AM           To: Deetta Perla, MD      Subject: Lab results                                              Here is Karina Nelson's Lamotrigine level from Monday. I saw her Monday and she reported what I thought to be a seizure aura the week before. She had feeling of heart racing and one arm trembling for couple minutes. Said she had it before once before she had convulsive seizure. Rested, then feelings went away. She hadn't taken her medication Monday AM when I saw her so I sent her for lab draw. She is taking Lamotrigine  1+1/2 AM and 2 PM.       Thanks,      Inetta Fermo      ----- Message -----         From: Lab in [facility]         Sent: 09/22/2014   8:51 AM           To: Elveria Rising, NP                         ------

## 2015-04-08 ENCOUNTER — Other Ambulatory Visit: Payer: Self-pay | Admitting: Family

## 2015-04-08 ENCOUNTER — Encounter: Payer: Self-pay | Admitting: Family

## 2015-04-18 ENCOUNTER — Encounter: Payer: Self-pay | Admitting: Family

## 2015-04-18 ENCOUNTER — Ambulatory Visit (INDEPENDENT_AMBULATORY_CARE_PROVIDER_SITE_OTHER): Payer: BLUE CROSS/BLUE SHIELD | Admitting: Family

## 2015-04-18 VITALS — BP 138/84 | HR 84 | Ht 64.75 in | Wt 239.6 lb

## 2015-04-18 DIAGNOSIS — G43009 Migraine without aura, not intractable, without status migrainosus: Secondary | ICD-10-CM | POA: Diagnosis not present

## 2015-04-18 DIAGNOSIS — G40309 Generalized idiopathic epilepsy and epileptic syndromes, not intractable, without status epilepticus: Secondary | ICD-10-CM

## 2015-04-18 NOTE — Progress Notes (Signed)
Patient: Karina Nelson MRN: [id number] Sex: female DOB: 1991/02/23  Provider: Elveria Rising, NP Location of Care: [facility] Child Neu[facility]  Note type: Routine return visit  History of Present Illness: Referral Source: Dr. Trina Ao History from: patient and [facility] chart Chief Complaint: Seizures   Karina E Gusman is a 24 y.o. 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 Karina was last seen September 20, 2014. She is taking and tolerating Lamictal for her seizure disorder. Her last convulsive seizure occurred December 11, 2011 in the setting of missed medication and sleep deprivation. She had a seizure aura in September 2015. A trough Lamictal level was done and the level was determined to be 5.69mcg/ml. The dose was increased and she says that she has had no auras or convulsions since then. She has tolerated the increase without side effects.   Karina has changed jobs since her last visit. 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 She works 2nd shift full time, with an additional overtime shift each week. Karina has had problems with insomnia in the past but says that has improved. She also has history of migraines but says that they are rare. She was thinking of returning to college to complete her degree but has decided against it.   Karina has been generally healthy since last seen. She remains quite obese. Her only complaint today is of nasal congestion related to seasonal allergies.  Review of Systems: Please see the HPI for neurologic and other pertinent review of systems. Otherwise, the following systems are noncontributory including constitutional, eyes, ears, nose and throat, cardiovascular, respiratory, gastrointestinal, genitourinary, musculoskeletal, skin, endocrine, hematologic/lymph, allergic/immunologic and psychiatric.   Past Medical History  Diagnosis Date  . Asthma   . Epilepsy     . Seasonal allergies   . Anemia    Hospitalizations: No., Head Injury: No., Nervous System Infections: No., Immunizations up to date: Yes.   Past Medical History Comments: Significant for seizures. She had a sleep study at [facility] in 2009. The study was normal and was similar to that of 2004 when it was concluded that she had normal sleep architecture and had more of a problem with sleep hygiene. It did not show any evidence of seizures.  Surgical History History reviewed. No pertinent past surgical history.  Family History family history is not on file. Family History is otherwise negative for migraines, seizures, cognitive impairment, blindness, deafness, birth defects, chromosomal disorder, autism.  Social History History   Social History  . Marital Status: Single    Spouse Name: N/A  . Number of Children: N/A  . Years of Education: N/A   Social History Main Topics  . Smoking status: Current Every Day Smoker    Types: Cigars  . Smokeless tobacco: Never Used     Comment: 1-2 cigars a day  . Alcohol Use: Yes     Comment: Patient drinks alcohol occasionally on the weekend only.  . Drug Use: No  . Sexual Activity: Yes   Other Topics Concern  . None   Social History Narrative   Educational level: graduated, some college Living with:  mother  Hobbies/Interest: Karina enjoys going shopping, bowling and eating out.  Allergies Allergies  Allergen Reactions  . Celery Oil Anaphylaxis  . Rice Anaphylaxis  . Strawberry Anaphylaxis  . Pineapple Itching  . Sudafed [Pseudoephedrine] Other (See Comments)    Dizziness, weakness  . Banana Itching    Physical Exam  BP 138/84 mmHg  Pulse 84  Ht 5' 4.75" (1.645 m)  Wt 239 lb 9.6 oz (108.682 kg)  BMI 40.16 kg/m2  LMP 04/18/2015 (Exact Date) General: well developed, well nourished obese young woman, seated on exam table, in no evident distress  Head: head normocephalic and atraumatic. Oropharynx benign.  Ears, Nose and Throat:  Oropharynx benign.  Neck: supple with no carotid or supraclavicular bruits.  Cardiovascular: regular rate and rhythm, no murmurs  Musculoskeletal: no obvious deformities or scoliosis  Skin: no rashes or lesions   Neurologic Exam  Mental Status: Awake and fully alert. Oriented to place and time. Recent and remote memory intact. Attention span, concentration, and fund of knowledge appropriate. Mood and affect appropriate.  Cranial Nerves: Fundoscopic exam reveals sharp disc margins. Pupils equal, briskly reactive to light. Extraocular movements full without nystagmus. Visual fields full to confrontation. Hearing intact and symmetric to finger rub. Facial sensation intact. Face, tongue, palate move normally and symmetrically. Neck flexion and extension normal.  Motor: Normal bulk and tone. Normal strength in all tested extremity muscles.  Sensory: Intact to touch and temperature in all extremities.  Coordination: Rapid alternating movements normal in all extremities. Finger-to-nose and heel-to-shin performed accurately bilaterally. Romberg negative.  Gait and Station: Arises from chair without difficulty. Stance is normal. Gait demonstrates normal stride length and balance. Able to heel, toe, and tandem walk without difficulty.  Reflexes: diminished and symmetric. Toes downgoing.   Impression 1. Generalized tonic-clonic seizures G40.309 2. Obesity 3. History of migraines 4. History of insomnia  Recommendations for plan of care The [facility]. [place] has neither had nor required imaging or lab studies since the last blood test in September 2015. 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 She has been seizure free since December 2012, but did have a seizure aura in September 2015. The Lamictal dose was increased at that time and she has remained seizure free. I  talked with [place] today about transition of care to an adult neurology practice. I will refer her to Dr Patrcia DollyKaren Aquino for her next visit in 6 months. She will continue taking Lamictal and will notify me if she has any seizures, auras or other concerns until she transitions to Dr Aquino's office.   I also talked with [place] about her blood pressure today. It ws 138/84, which is higher than her readings have been in the past. She remains obese, and I am concerned about her developing chronic medical problems such as hypertension and diabetes. She said that her aunt was a Engineer, civil (consulting)nurse and could check her blood pressure, and II asked her to recheck the blood pressure in a few days. She was also instructed to follow up with her PCP if the reading remains elevated or his higher. I also encouraged to her modify her diet and work on weight loss. I shared with her the last 3 weights obtained and about my concern about the gradual increase in weight.   Wt Readings from Last 3 Encounters:  04/18/15 239 lb 9.6 oz (108.682 kg)  09/20/14 236 lb 3.2 oz (107.14 kg)  03/09/14 231 lb 9.6 oz (105.053 kg)   [place] agreed with the treatment plans made today.   The medication list was reviewed and reconciled.  No changes were made in the prescribed medications today.  A complete medication list was provided to the patient.   Dr. Sharene SkeansHickling was consulted regarding this  patient.  Total time spent with the patient was 30 minutes, of which 50% or more was spent in counseling and coordination of care.

## 2015-04-20 MED ORDER — LAMOTRIGINE 100 MG PO TABS: ORAL_TABLET | ORAL | Status: DC

## 2015-04-20 NOTE — Patient Instructions (Signed)
I am pleased that you have remained seizure free. Continue to take Lamotrigine 100mg  - 2 tablets twice per day. It is important that you are compliant with taking your medication to try to remain seizure free.   I am concerned today that your blood pressure is higher than it has been in the past at 138/84. Please have your aunt check your blood pressure and if it remains elevated or is higher, follow up with your PCP.   I am also concerned about the ongoing weight gain. Try to find ways to limit portions and snacks, and become more active. Here are your last 3 weights: Wt Readings from Last 3 Encounters:  04/18/15 239 lb 9.6 oz (108.682 kg)  09/20/14 236 lb 3.2 oz (107.14 kg)  03/09/14 231 lb 9.6 oz (105.053 kg)   I will refer you to Dr Patrcia DollyKaren Nelson as we discussed today for transition of care to adult neurology practice. She will see you at your visit in 6 months. In the meantime if you have any seizures, or other questions or concerns, contact me.

## 2015-09-23 ENCOUNTER — Emergency Department (HOSPITAL_COMMUNITY)
Admission: EM | Admit: 2015-09-23 | Discharge: 2015-09-23 | Disposition: A | Discharge status: Home / Self Care | Payer: BLUE CROSS/BLUE SHIELD | Attending: Emergency Medicine | Admitting: Emergency Medicine

## 2015-09-23 ENCOUNTER — Encounter (HOSPITAL_COMMUNITY): Payer: Self-pay | Admitting: Cardiology

## 2015-09-23 DIAGNOSIS — Z720 Tobacco use: Secondary | ICD-10-CM | POA: Insufficient documentation

## 2015-09-23 DIAGNOSIS — J45909 Unspecified asthma, uncomplicated: Secondary | ICD-10-CM | POA: Insufficient documentation

## 2015-09-23 DIAGNOSIS — Z862 Personal history of diseases of the blood and blood-forming organs and certain disorders involving the immune mechanism: Secondary | ICD-10-CM | POA: Diagnosis not present

## 2015-09-23 DIAGNOSIS — K088 Other specified disorders of teeth and supporting structures: Secondary | ICD-10-CM | POA: Insufficient documentation

## 2015-09-23 DIAGNOSIS — K029 Dental caries, unspecified: Secondary | ICD-10-CM | POA: Insufficient documentation

## 2015-09-23 DIAGNOSIS — G40909 Epilepsy, unspecified, not intractable, without status epilepticus: Secondary | ICD-10-CM | POA: Diagnosis not present

## 2015-09-23 MED ORDER — PENICILLIN V POTASSIUM 500 MG PO TABS: 500.0000 mg | ORAL_TABLET | Freq: Three times a day (TID) | ORAL | Status: DC

## 2015-09-23 MED ORDER — NAPROXEN 500 MG PO TABS: 500.0000 mg | ORAL_TABLET | Freq: Two times a day (BID) | ORAL | Status: DC

## 2015-09-23 NOTE — ED Notes (Signed)
Pt reports right upper dental pain for the past couple of weeks. States she has taken aleve at home without much relief.

## 2015-09-23 NOTE — ED Provider Notes (Signed)
CSN: [id number]     Arrival date & time 09/23/15  1441 History  This chart was scribed for Fayrene Helper, PA-C, working with Mirian Mo, MD by Elon Spanner, ED Scribe. This patient was seen in room TR06C/TR06C and the patient's care was started at 3:18 PM.   Chief Complaint  Patient presents with  . Dental Pain   The history is provided by the patient. No language interpreter was used.    HPI Comments: Karina Nelson is a 24 y.o. female who presents to the Emergency Department complaining of chronic, intermittent, sharp, worsening, moderate right upper dental pain onset 2 years ago with worsening last night, worse with chewing/cold air, relieved minimally with Aleve.  Associated symtpoms include trouble sleeping due to pain.  Patient was scheduled to be seen by a dentist ~ 2 years ago but did not f/u due to life complications.  She denies other complaints.  She denies fever.     Past Medical History  Diagnosis Date  . Asthma   . Epilepsy   . Seasonal allergies   . Anemia    History reviewed. No pertinent past surgical history. History reviewed. No pertinent family history. Social History  Substance Use Topics  . Smoking status: Current Every Day Smoker    Types: Cigars  . Smokeless tobacco: Never Used     Comment: 1-2 cigars a day  . Alcohol Use: Yes     Comment: Patient drinks alcohol occasionally on the weekend only.   OB History    No data available     Review of Systems  Constitutional: Negative for fever.  HENT: Positive for dental problem.   All other systems reviewed and are negative.     Allergies  Celery oil; Rice; Strawberry; Pineapple; Sudafed; and Banana  Home Medications   Prior to Admission medications   Medication Sig Start Date End Date Taking? Authorizing Provider  cetirizine (ZYRTEC) 10 MG tablet Take 10 mg by mouth daily.    Historical Provider, MD  ibuprofen (ADVIL,MOTRIN) 200 MG tablet Take 400 mg by mouth every 6 (six) hours as needed. For pain      Historical Provider, MD  ibuprofen (ADVIL,MOTRIN) 800 MG tablet Take 1 tablet (800 mg total) by mouth every 8 (eight) hours as needed for pain. 10/13/13   Charlestine Night, PA-C  lamoTRIgine (LAMICTAL) 100 MG tablet TAKE 2 TABLETS EACH MORNING AND 2 TABLETS AT BEDTIME. 04/20/15   Elveria Rising, NP   BP 123/69 mmHg  Pulse 99  Temp(Src) 98 F (36.7 C) (Oral)  Resp 16  Wt 239 lb (108.41 kg)  SpO2 100%  LMP 09/21/2015 Physical Exam  Constitutional: She is oriented to person, place, and time. She appears well-developed and well-nourished. No distress.  HENT:  Head: Normocephalic and atraumatic.  Dental decay noted to tooth # 2.  TTP.  No obvious abscess or trismus.    Eyes: Conjunctivae and EOM are normal.  Neck: Neck supple. No tracheal deviation present.  Cardiovascular: Normal rate.   Pulmonary/Chest: Effort normal. No respiratory distress.  Musculoskeletal: Normal range of motion.  Neurological: She is alert and oriented to person, place, and time.  Skin: Skin is warm and dry.  Psychiatric: She has a normal mood and affect. Her behavior is normal.  Nursing note and vitals reviewed.   ED Course  Procedures (including critical care time)`00  DIAGNOSTIC STUDIES: Oxygen Saturation is 100% on RA, normal by my interpretation.    COORDINATION OF CARE:  3:26 PM Will order  ibuprofen and prescribe antibiotic.  Patient should f/u with dentist.  Patient acknowledges and agrees with plan.        MDM   Final diagnoses:  Pain due to dental caries    BP 123/69 mmHg  Pulse 99  Temp(Src) 98 F (36.7 C) (Oral)  Resp 16  Wt 239 lb (108.41 kg)  SpO2 100%  LMP 09/21/2015   I personally performed the services described in this documentation, which was scribed in my presence. The recorded information has been reviewed and is accurate.     Fayrene Helper, PA-C 09/23/15 1528  Mirian Mo, MD 09/24/15 1154

## 2015-09-23 NOTE — Discharge Instructions (Signed)

## 2015-09-26 ENCOUNTER — Telehealth: Payer: Self-pay | Admitting: *Deleted

## 2015-09-26 DIAGNOSIS — G40309 Generalized idiopathic epilepsy and epileptic syndromes, not intractable, without status epilepticus: Secondary | ICD-10-CM

## 2015-09-26 NOTE — Telephone Encounter (Signed)
I attempted to call Mylan's mother back but she did not answer and the voicemail on her phone has not been set up. I will try to reach her tomorrow. TG

## 2015-09-26 NOTE — Telephone Encounter (Signed)
Bird's mother, Meriam Sprague, called and states that [place] told her that she needs a referral to an adult neurologist due to aging out at our office. Meriam Sprague states that she thinks that [place] is due for an appointment but has bee "dragging her feet" to get it done. Meriam Sprague would like to talk to Inetta Fermo further about the need to be referred to an Adult Neurologist.  CB#: [phone]

## 2015-09-27 ENCOUNTER — Emergency Department (INDEPENDENT_AMBULATORY_CARE_PROVIDER_SITE_OTHER)
Admission: EM | Admit: 2015-09-27 | Discharge: 2015-09-27 | Disposition: A | Discharge status: Home / Self Care | Payer: BLUE CROSS/BLUE SHIELD | Source: Home / Self Care | Attending: Family Medicine | Admitting: Family Medicine

## 2015-09-27 ENCOUNTER — Encounter (HOSPITAL_COMMUNITY): Payer: Self-pay | Admitting: *Deleted

## 2015-09-27 DIAGNOSIS — K047 Periapical abscess without sinus: Secondary | ICD-10-CM

## 2015-09-27 MED ORDER — AMOXICILLIN-POT CLAVULANATE 875-125 MG PO TABS: 1.0000 | ORAL_TABLET | Freq: Two times a day (BID) | ORAL | Status: DC

## 2015-09-27 NOTE — Discharge Instructions (Signed)

## 2015-09-27 NOTE — ED Provider Notes (Signed)
CSN: [id number]     Arrival date & time 09/27/15  1633 History   First MD Initiated Contact with Patient 09/27/15 1658     No chief complaint on file.  (Consider location/radiation/quality/duration/timing/severity/associated sxs/prior Treatment) The history is provided by the patient.   this is a 24 year old unemployed woman is having persistent problem with tooth #2. She was in the emergency room 5 days ago and given amoxicillin but she's had no improvement. She continues to have aching.   Past Medical History  Diagnosis Date  . Asthma   . Epilepsy   . Seasonal allergies   . Anemia    No past surgical history on file. No family history on file. Social History  Substance Use Topics  . Smoking status: Current Every Day Smoker    Types: Cigars  . Smokeless tobacco: Never Used     Comment: 1-2 cigars a day  . Alcohol Use: Yes     Comment: Patient drinks alcohol occasionally on the weekend only.   OB History    No data available     Review of Systems  Constitutional: Negative.   HENT: Positive for dental problem.   Eyes: Negative.   Respiratory: Negative.   Cardiovascular: Negative.     Allergies  Celery oil; Rice; Strawberry; Pineapple; Sudafed; and Banana  Home Medications   Prior to Admission medications   Medication Sig Start Date End Date Taking? Authorizing Provider  cetirizine (ZYRTEC) 10 MG tablet Take 10 mg by mouth daily.    Historical Provider, MD  lamoTRIgine (LAMICTAL) 100 MG tablet TAKE 2 TABLETS EACH MORNING AND 2 TABLETS AT BEDTIME. 04/20/15   Elveria Rising, NP  naproxen (NAPROSYN) 500 MG tablet Take 1 tablet (500 mg total) by mouth 2 (two) times daily. 09/23/15   Fayrene Helper, PA-C  penicillin v potassium (VEETID) 500 MG tablet Take 1 tablet (500 mg total) by mouth 3 (three) times daily. 09/23/15   Fayrene Helper, PA-C   Meds Ordered and Administered this Visit  Medications - No data to display  BP 126/59 mmHg  Pulse 87  Temp(Src) 98.4 F (36.9 C)  (Oral)  Resp 16  SpO2 100%  LMP 09/21/2015 No data found.   Physical Exam  Constitutional: She appears well-developed and well-nourished.  HENT:  Head: Normocephalic and atraumatic.  Right Ear: External ear normal.  Left Ear: External ear normal.  Mouth/Throat: Oropharynx is clear and moist.  Mild swelling tooth #2  Eyes: Conjunctivae are normal. Pupils are equal, round, and reactive to light.  Neck: Normal range of motion. Neck supple. No thyromegaly present.  Pulmonary/Chest: Effort normal.  Lymphadenopathy:    She has no cervical adenopathy.  Skin: Skin is warm and dry.  Nursing note and vitals reviewed.   ED Course  Procedures (including critical care time)     MDM       ICD-9-CM ICD-10-CM   1. Apical abscess 522.5 K04.7 amoxicillin-clavulanate (AUGMENTIN) 875-125 MG tablet     Signed, Elvina Sidle, MD    Elvina Sidle, MD 09/27/15 [id number]

## 2015-09-27 NOTE — ED Notes (Signed)
Pt  Reports    r   Upper molar   Toothache     Started    Last  Week   Seen  Er   5  Days  Ago            Pt  Taking  amox

## 2015-09-28 NOTE — Telephone Encounter (Signed)
I have been unable to reach Loetta's mother to answer her questions because there is no answer to her phone and her phone says that voicemail has not been set up. I will mail her a letter. TG

## 2015-10-03 NOTE — Telephone Encounter (Signed)
Lanyah's mother Lowanda Foster called today and left her work number for me to call her. I called her and explained that [place] was referred to Dr Karel Jarvis when she was see here in April. I gave Mom Dr Aquino's phone number to call that office. I also put in a new referral in case the one from April has expired. TG

## 2015-10-03 NOTE — Addendum Note (Signed)
Addended by: Princella Ion on: 10/03/2015 02:44 PM   Modules accepted: Orders

## 2015-11-10 ENCOUNTER — Other Ambulatory Visit: Payer: Self-pay | Admitting: Family

## 2015-11-10 DIAGNOSIS — G40309 Generalized idiopathic epilepsy and epileptic syndromes, not intractable, without status epilepticus: Secondary | ICD-10-CM

## 2015-11-10 MED ORDER — LAMOTRIGINE 100 MG PO TABS: ORAL_TABLET | ORAL | Status: DC

## 2015-11-17 ENCOUNTER — Ambulatory Visit (INDEPENDENT_AMBULATORY_CARE_PROVIDER_SITE_OTHER): Payer: BLUE CROSS/BLUE SHIELD | Admitting: Neurology

## 2015-11-17 ENCOUNTER — Encounter: Payer: Self-pay | Admitting: Neurology

## 2015-11-17 ENCOUNTER — Other Ambulatory Visit (INDEPENDENT_AMBULATORY_CARE_PROVIDER_SITE_OTHER): Payer: BLUE CROSS/BLUE SHIELD

## 2015-11-17 VITALS — BP 100/72 | HR 16 | Resp 84 | Ht 65.75 in | Wt 256.0 lb

## 2015-11-17 DIAGNOSIS — G40309 Generalized idiopathic epilepsy and epileptic syndromes, not intractable, without status epilepticus: Secondary | ICD-10-CM

## 2015-11-17 DIAGNOSIS — G40009 Localization-related (focal) (partial) idiopathic epilepsy and epileptic syndromes with seizures of localized onset, not intractable, without status epilepticus: Secondary | ICD-10-CM | POA: Diagnosis not present

## 2015-11-17 LAB — COMPREHENSIVE METABOLIC PANEL
ALBUMIN: 4 g/dL (ref 3.5–5.2)
ALK PHOS: 65 U/L (ref 39–117)
ALT: 15 U/L (ref 0–35)
AST: 15 U/L (ref 0–37)
BILIRUBIN TOTAL: 0.5 mg/dL (ref 0.2–1.2)
BUN: 8 mg/dL (ref 6–23)
CALCIUM: 8.9 mg/dL (ref 8.4–10.5)
CO2: 28 mEq/L (ref 19–32)
Chloride: 104 mEq/L (ref 96–112)
Creatinine, Ser: 0.81 mg/dL (ref 0.40–1.20)
GFR: 110.09 mL/min (ref >60.00)
GLUCOSE: 85 mg/dL (ref 70–99)
POTASSIUM: 3.7 meq/L (ref 3.5–5.1)
Sodium: 139 mEq/L (ref 135–145)
TOTAL PROTEIN: 7.2 g/dL (ref 6.0–8.3)

## 2015-11-17 LAB — CBC
HEMATOCRIT: 35.8 % — AB (ref 36.0–46.0)
HEMOGLOBIN: 11.9 g/dL — AB (ref 12.0–15.0)
MCHC: 33.3 g/dL (ref 30.0–36.0)
MCV: 89.2 fl (ref 78.0–100.0)
PLATELETS: 221 10*3/uL (ref 150.0–400.0)
RBC: 4.01 Mil/uL (ref 3.87–5.11)
RDW: 15.3 % (ref 11.5–15.5)
WBC: 7.3 10*3/uL (ref 4.0–10.5)

## 2015-11-17 MED ORDER — LAMOTRIGINE 100 MG PO TABS
ORAL_TABLET | ORAL | Status: DC
Start: 2015-11-17 — End: 2017-03-10

## 2015-11-17 NOTE — Progress Notes (Signed)
NEUROLOGY CONSULTATION NOTE  Karina Nelson MRN: [id number] DOB: Mar 04, 1991  Referring provider: Elveria Risingina Goodpasture, NP Primary care provider: Dr. Fleet ContrasEdwin Avbuere  Reason for consult:  Establish adult epilepsy care  Thank you for your kind referral of Karina E Jamie for consultation of the above symptoms. Although her history is well known to you, please allow me to reiterate it for the purpose of our medical record. The patient was accompanied to the clinic by her mother who also provides collateral information. Records and images were personally reviewed where available.  HISTORY OF PRESENT ILLNESS: This is a 24 year old right-handed woman with a history of seizures since childhood. Records were reviewed from Dr. Darl HouseholderHickling's office, as well as [facility] where she had seen Dr. Massie MaroonGallantine. 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 On review of [facility] notes, she has a diagnosis of complex partial epilepsy with secondary generalization. Karina and her mother report that she had her first seizure at age 89 after receiving a lidocaine block and developed a generalized tonic-clonic seizure. No other seizures until the 4th grade when she was described by teachers as staring with unresponsive behavior. Her mother has not seen any of the staring spells, and Karina states she was doing it on purpose at that time. She saw a neurologist and had a normal MRI and EEG. In August 2007, she was asleep on the couch when her parents witnessed her have a seizure where her right arm stiffened, followed by generalized rhythmic shaking. She began having similar episodes every 2 weeks, she would feel her right arm going stiff and extending, then she would lose consciousness with associated tongue bite and urinary incontinence. There was some catamenial component to her seizures, occurring more before or soon after her menstrual period. She also  reports episodes where her right arm becomes very heavy and numb for 1-2 minutes. She was initially started on Depakote which caused weight gain with no improvement in seizures. She was switched to Keppra and did well, then was switched to Lamictal in 2010.  There are 2 EEG reports available, one from [facility] in 04/2008, normal wake EEG. Another from 06/2011 by Dr. Sharene SkeansHickling was a normal wake and drowsy EEG. I personally reviewed MRI brain with and without contrast done 09/2006 which did not show any acute abnormalities, hippocampi symmetric without any abnormal signal or enhancement. She reports her last seizures were in 2010 and 2012, after missing medication and sleep deprivation. Her Lamictal dose was increased to 200mg  BID last 08/2014 after she had an aura while driving where she felt her heart racing and her right arm trembling almost like she would have a seizure but did not progress. She has been tolerating Lamictal 200mg  BID without any side effects.   She denies any olfactory/gustatory hallucinations, deja vu, rising epigastric sensation, no headaches, dizziness, diplopia, dysarthria, dysphagia, neck pain, bowel/bladder dysfunction. She has had sleep difficulties for the past 13 years and has not tried any medication. She usually gets 6 hours of on and off sleep. She has back pain from lifting heavy things at work. She works from [employer] to 11:30pm. She reports depression but is not interested in seeing a counselor or psychiatrist at this time. She has a diagnosis of ADHD and was on Adderall in the past, which "zombified" her, and stopped this on her own.   Epilepsy Risk Factors:  A few cousins on her father side have seizures. Otherwise  she had a normal birth and early development.  There is no history of febrile convulsions, CNS infections such as meningitis/encephalitis, significant traumatic brain injury, neurosurgical procedures  Prior AEDs: Depakote, Keppra  PAST MEDICAL HISTORY: Past Medical History    Diagnosis Date  . Asthma   . Epilepsy (HCC)   . Seasonal allergies   . Anemia     PAST SURGICAL HISTORY: No past surgical history on file.  MEDICATIONS: Current Outpatient Prescriptions on File Prior to Visit  Medication Sig Dispense Refill  . cetirizine (ZYRTEC) 10 MG tablet Take 10 mg by mouth as needed.     . lamoTRIgine (LAMICTAL) 100 MG tablet TAKE 2 TABLETS EACH MORNING AND 2 TABLETS AT BEDTIME. 120 tablet 0  . naproxen (NAPROSYN) 500 MG tablet Take 1 tablet (500 mg total) by mouth 2 (two) times daily. 30 tablet 0   No current facility-administered medications on file prior to visit.    ALLERGIES: Allergies  Allergen Reactions  . Celery Oil Anaphylaxis  . Rice Anaphylaxis  . Strawberry Extract Anaphylaxis  . Pineapple Itching  . Sudafed [Pseudoephedrine] Other (See Comments)    Dizziness, weakness  . Banana Itching    FAMILY HISTORY: No family history on file.  SOCIAL HISTORY: Social History   Social History  . Marital Status: Single    Spouse Name: N/A  . Number of Children: N/A  . Years of Education: N/A   Occupational History  . Not on file.   Social History Main Topics  . Smoking status: Current Every Day Smoker    Types: Cigars  . Smokeless tobacco: Never Used     Comment: 1-2 cigars a day  . Alcohol Use: Yes     Comment: Patient drinks alcohol occasionally on the weekend only.  . Drug Use: No  . Sexual Activity: Yes   Other Topics Concern  . Not on file   Social History Narrative    REVIEW OF SYSTEMS: Constitutional: No fevers, chills, or sweats, no generalized fatigue, change in appetite Eyes: No visual changes, double vision, eye pain Ear, nose and throat: No hearing loss, ear pain, nasal congestion, sore throat Cardiovascular: No chest pain, palpitations Respiratory:  No shortness of breath at rest or with exertion, wheezes GastrointestinaI: No nausea, vomiting, diarrhea, abdominal pain, fecal incontinence Genitourinary:  No  dysuria, urinary retention or frequency Musculoskeletal:  No neck pain, +back pain Integumentary: No rash, pruritus, skin lesions Neurological: as above Psychiatric: No depression, insomnia, anxiety Endocrine: No palpitations, fatigue, diaphoresis, mood swings, change in appetite, change in weight, increased thirst Hematologic/Lymphatic:  No anemia, purpura, petechiae. Allergic/Immunologic: no itchy/runny eyes, nasal congestion, recent allergic reactions, rashes  PHYSICAL EXAM: Filed Vitals:   11/17/15 0903  BP: 100/72  Pulse: 16  Resp: 84   General: No acute distress, obese Head:  Normocephalic/atraumatic Eyes: Fundoscopic exam shows bilateral sharp discs, no vessel changes, exudates, or hemorrhages Neck: supple, no paraspinal tenderness, full range of motion Back: No paraspinal tenderness Heart: regular rate and rhythm Lungs: Clear to auscultation bilaterally. Vascular: No carotid bruits. Skin/Extremities: No rash, no edema Neurological Exam: Mental status: alert and oriented to person, place, and time, no dysarthria or aphasia, Fund of knowledge is appropriate.  Recent and remote memory are intact. 3/3 delayed recall  Attention and concentration are normal.    Able to name objects and repeat phrases. Cranial nerves: CN I: not tested CN II: pupils equal, round and reactive to light, visual fields intact, fundi unremarkable. CN III, IV, VI:  full range of motion, no nystagmus, no ptosis CN V: facial sensation intact CN VII: upper and lower face symmetric CN VIII: hearing intact to finger rub CN IX, X: gag intact, uvula midline CN XI: sternocleidomastoid and trapezius muscles intact CN XII: tongue midline Bulk & Tone: normal, no fasciculations. Motor: 5/5 throughout with no pronator drift. Sensation: intact to light touch, cold, pin, vibration and joint position sense.  No extinction to double simultaneous stimulation.  Romberg test negative Deep Tendon Reflexes: +2  throughout, no ankle clonus Plantar responses: downgoing bilaterally Cerebellar: no incoordination on finger to nose, heel to shin. No dysdiadochokinesia Gait: narrow-based and steady, able to tandem walk adequately. 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 Last GTC was in 2012. She had some right arm symptoms last 08/2014 concerning for partial seizure, and dose of Lamictal was increased to  BID with no further similar symptoms. She denies any side effects on Lamictal, continue current dose. Safety labs will be checked today, CBC, CMP, as well as baseline Lamictal level. She reports sleep difficulties and will try melatonin and practicing good sleep hygiene. We also discussed depression, she would like to monitor for now and knows to call if she would like to see Behavioral Health. We discussed avoidance of seizure triggers, including missing medications, alcohol, and sleep deprivation. She has no pregnancy plans at this time. [place] driving laws were discussed with the patient, and she knows to stop driving after a seizure, until 6 months seizure-free. She will follow-up in 6 months or earlier if needed.   Thank you for allowing me to participate in the care of this patient. Please do not hesitate to call for any questions or concerns.   Patrcia Dolly, M.D.  CC: Elveria Rising, Dr. Concepcion Elk

## 2015-11-17 NOTE — Patient Instructions (Signed)
1. Continue Lamictal 200mg  twice a day 2. Bloodwork for Lamictal level, CBC, CMP  3. Try melatonin 2mg  at night for sleep. Practice good sleep hygiene 4. Follow-up in 6 months, call for any problems  Seizure Precautions: 1. If medication has been prescribed for you to prevent seizures, take it exactly as directed.  Do not stop taking the medicine without talking to your doctor first, even if you have not had a seizure in a long time.   2. Avoid activities in which a seizure would cause danger to yourself or to others.  Don't operate dangerous machinery, swim alone, or climb in high or dangerous places, such as on ladders, roofs, or girders.  Do not drive unless your doctor says you may.  3. If you have any warning that you may have a seizure, lay down in a safe place where you can't hurt yourself.    4.  No driving for 6 months from last seizure, as per [facility].   Please refer to the following link on the [employer]ile[employer] of [employer]'s website for more information: http://www.epilepsyfoundation.org/answerplace/Social/driving/drivingu.cfm   5.  Maintain good sleep hygiene. Avoid alcohol.  6.  Notify your neurology if you are planning pregnancy or if you become pregnant.  7.  Contact your doctor if you have any problems that may be related to the medicine you are taking.  8.  Call 911 and bring the patient back to the ED if:        A.  The seizure lasts longer than 5 minutes.       B.  The patient doesn't awaken shortly after the seizure  C.  The patient has new problems such as difficulty seeing, speaking or moving  D.  The patient was injured during the seizure  E.  The patient has a temperature over 102 F (39C)  F.  The patient vomited and now is having trouble breathing

## 2015-11-20 LAB — LAMOTRIGINE LEVEL: LAMOTRIGINE LVL: 8.9 ug/mL (ref 4.0–18.0)

## 2015-11-22 ENCOUNTER — Telehealth: Payer: Self-pay | Admitting: Family Medicine

## 2015-11-22 NOTE — Telephone Encounter (Signed)
-----   Message from Van ClinesKaren M Aquino, MD sent at 11/22/2015  8:35 AM EST ----- Pls let her know bloodwork looks good, Lamictal level within range. Thanks

## 2015-11-22 NOTE — Telephone Encounter (Signed)
Patient was notified of result. 

## 2016-02-15 ENCOUNTER — Encounter: Payer: Self-pay | Admitting: Neurology

## 2016-02-22 ENCOUNTER — Telehealth: Payer: Self-pay | Admitting: Neurology

## 2016-02-22 NOTE — Telephone Encounter (Signed)
Returned call. I did tell patient that lamictal isn't sampled due to it being a generic drug. I did advise patient that she could call the [employer] [facility] [place] to see if they could be of some assistance. They do have programs where the assist patients in getting their medication. [phone].

## 2016-02-22 NOTE — Telephone Encounter (Signed)
Karina Bible called and stated that she could not afford her meds this month because of no ins. She is in the process of getting ins.  But would like samples if at all possible. LamoTrigine Is the drug she is in need of.  Said if we don't have samples would the drug company have A program she could apply for and  get the drug.  Please call her.

## 2016-05-16 ENCOUNTER — Ambulatory Visit: Payer: BLUE CROSS/BLUE SHIELD | Admitting: Neurology

## 2016-07-11 ENCOUNTER — Encounter (HOSPITAL_COMMUNITY): Payer: Self-pay

## 2016-07-11 ENCOUNTER — Emergency Department (HOSPITAL_COMMUNITY)
Admission: EM | Admit: 2016-07-11 | Discharge: 2016-07-11 | Disposition: A | Discharge status: Home / Self Care | Payer: BLUE CROSS/BLUE SHIELD | Attending: Emergency Medicine | Admitting: Emergency Medicine

## 2016-07-11 DIAGNOSIS — K029 Dental caries, unspecified: Secondary | ICD-10-CM

## 2016-07-11 DIAGNOSIS — Z87891 Personal history of nicotine dependence: Secondary | ICD-10-CM | POA: Insufficient documentation

## 2016-07-11 DIAGNOSIS — J45909 Unspecified asthma, uncomplicated: Secondary | ICD-10-CM | POA: Insufficient documentation

## 2016-07-11 DIAGNOSIS — Z79899 Other long term (current) drug therapy: Secondary | ICD-10-CM | POA: Insufficient documentation

## 2016-07-11 DIAGNOSIS — Z791 Long term (current) use of non-steroidal anti-inflammatories (NSAID): Secondary | ICD-10-CM | POA: Insufficient documentation

## 2016-07-11 MED ORDER — CLINDAMYCIN HCL 300 MG PO CAPS: 300.0000 mg | ORAL_CAPSULE | Freq: Four times a day (QID) | ORAL | Status: DC

## 2016-07-11 MED ORDER — LIDOCAINE VISCOUS 2 % MT SOLN
15.0000 mL | Freq: Once | OROMUCOSAL | Status: AC
  Administered 2016-07-11: 15 mL via OROMUCOSAL
  Filled 2016-07-11: qty 15

## 2016-07-11 MED ORDER — DICLOFENAC SODIUM ER 100 MG PO TB24: 100.0000 mg | ORAL_TABLET | Freq: Every day | ORAL | Status: DC

## 2016-07-11 MED ORDER — DICLOFENAC SODIUM 50 MG PO TBEC
50.0000 mg | DELAYED_RELEASE_TABLET | Freq: Two times a day (BID) | ORAL | Status: DC
  Administered 2016-07-11: 50 mg via ORAL
  Filled 2016-07-11: qty 1

## 2016-07-11 MED ORDER — CLINDAMYCIN HCL 300 MG PO CAPS
300.0000 mg | ORAL_CAPSULE | Freq: Once | ORAL | Status: AC
  Administered 2016-07-11: 300 mg via ORAL
  Filled 2016-07-11: qty 1

## 2016-07-11 NOTE — ED Provider Notes (Signed)
CSN: [id number]     Arrival date & time 07/11/16  0239 History   First MD Initiated Contact with Patient 07/11/16 0443     Chief Complaint  Patient presents with  . Dental Pain     (Consider location/radiation/quality/duration/timing/severity/associated sxs/prior Treatment) Patient is a 25 y.o. female presenting with tooth pain. The history is provided by the patient.  Dental Pain Location:  Upper Upper teeth location:  1/RU 3rd molar and 16/LU 3rd molar Quality:  Constant Severity:  Severe Onset quality:  Gradual Timing:  Constant Progression:  Unchanged Chronicity:  Recurrent Context: dental fracture and poor dentition   Previous work-up:  Dental exam Relieved by:  Nothing Worsened by:  Nothing tried Ineffective treatments:  None tried Associated symptoms: no congestion, no drooling, no facial swelling and no fever   Risk factors: no alcohol problem     Past Medical History  Diagnosis Date  . Asthma   . Epilepsy (HCC)   . Seasonal allergies   . Anemia    Past Surgical History  Procedure Laterality Date  . Other surgical history      cyst removal behind left ear   Family History  Problem Relation Age of Onset  . Cancer Father   . Heart disease Father   . Diabetes Paternal Grandfather   . Cancer Paternal Aunt   . Hypertension Mother    Social History  Substance Use Topics  . Smoking status: Former Smoker    Types: Cigars  . Smokeless tobacco: Never Used     Comment: 1-2 cigars a day  . Alcohol Use: 0.0 oz/week    0 Standard drinks or equivalent per week     Comment: Patient drinks alcohol occasionally on the weekend only.   OB History    No data available     Review of Systems  Constitutional: Negative for fever.  HENT: Positive for dental problem. Negative for congestion, drooling and facial swelling.   All other systems reviewed and are negative.     Allergies  Celery oil; Rice; Strawberry extract; Pineapple; Sudafed; and Banana  Home  Medications   Prior to Admission medications   Medication Sig Start Date End Date Taking? Authorizing Provider  cetirizine (ZYRTEC) 10 MG tablet Take 10 mg by mouth as needed.     Historical Provider, MD  lamoTRIgine (LAMICTAL) 100 MG tablet TAKE 2 TABLETS EACH MORNING AND 2 TABLETS AT BEDTIME. 11/17/15   Van Clines, MD  naproxen (NAPROSYN) 500 MG tablet Take 1 tablet (500 mg total) by mouth 2 (two) times daily. 09/23/15   Fayrene Helper, PA-C   BP 120/79 mmHg  Pulse 87  Temp(Src) 99.4 F (37.4 C) (Oral)  Resp 18  SpO2 100% Physical Exam  Constitutional: She is oriented to person, place, and time. She appears well-developed and well-nourished.  HENT:  Head: Normocephalic and atraumatic.  Mouth/Throat: Oropharynx is clear and moist. No trismus in the jaw. Dental caries present. No dental abscesses or uvula swelling.    Eyes: Conjunctivae are normal. Pupils are equal, round, and reactive to light.  Neck: Normal range of motion. Neck supple.  Cardiovascular: Normal rate, regular rhythm and intact distal pulses.   Pulmonary/Chest: Effort normal and breath sounds normal. No respiratory distress. She has no wheezes. She has no rales.  Abdominal: Soft. Bowel sounds are normal. She exhibits no distension. There is no tenderness.  Musculoskeletal: Normal range of motion.  Lymphadenopathy:    She has no cervical adenopathy.  Neurological: She is alert  and oriented to person, place, and time.  Skin: Skin is warm and dry.  Psychiatric: She has a normal mood and affect.    ED Course  Procedures (including critical care time) Labs Review Labs Reviewed - No data to display  Imaging Review No results found. I have personally reviewed and evaluated these images and lab results as part of my medical decision-making.   EKG Interpretation None      MDM   Final diagnoses:  None    Filed Vitals:   07/11/16 0249  BP: 120/79  Pulse: 87  Temp: 99.4 F (37.4 C)  Resp: 18   Will  start clindamycin and NSAIDs and have patient follow up with oral surgery for extractions.  Strict return precautions given    Judeth Gilles, MD 07/11/16 [phone]

## 2016-07-11 NOTE — ED Notes (Signed)
Pt given ice pack

## 2016-07-11 NOTE — Discharge Instructions (Signed)
Dental Caries °Dental caries is tooth decay. This decay can cause a hole in teeth (cavity) that can get bigger and deeper over time. °HOME CARE °· Brush and floss your teeth. Do this at least two times a day. °· Use a fluoride toothpaste. °· Use a mouth rinse if told by your dentist or doctor. °· Eat less sugary and starchy foods. Drink less sugary drinks. °· Avoid snacking often on sugary and starchy foods. Avoid sipping often on sugary drinks. °· Keep regular checkups and cleanings with your dentist. °· Use fluoride supplements if told by your dentist or doctor. °· Allow fluoride to be applied to teeth if told by your dentist or doctor. °  °This information is not intended to replace advice given to you by your health care provider. Make sure you discuss any questions you have with your health care provider. °  °Document Released: 09/25/2008 Document Revised: 01/07/2015 Document Reviewed: 12/19/2012 °Elsevier Interactive Patient Education ©2016 [employer] ° °

## 2016-07-11 NOTE — ED Notes (Signed)
Pt complains of dental pain around her wisdom teeth, she no longer has a Education officer, communitydentist

## 2016-07-11 NOTE — ED Notes (Signed)
MD at bedside. 

## 2016-08-27 ENCOUNTER — Ambulatory Visit: Payer: BLUE CROSS/BLUE SHIELD | Admitting: Neurology

## 2016-09-23 ENCOUNTER — Encounter (HOSPITAL_COMMUNITY): Payer: Self-pay | Admitting: *Deleted

## 2016-09-23 ENCOUNTER — Inpatient Hospital Stay (HOSPITAL_COMMUNITY)
Admission: AD | Admit: 2016-09-23 | Discharge: 2016-09-23 | Disposition: A | Discharge status: Home / Self Care | Payer: Medicaid Other | Source: Ambulatory Visit | Attending: Obstetrics & Gynecology | Admitting: Obstetrics & Gynecology

## 2016-09-23 ENCOUNTER — Inpatient Hospital Stay (HOSPITAL_COMMUNITY): Payer: Medicaid Other

## 2016-09-23 DIAGNOSIS — O209 Hemorrhage in early pregnancy, unspecified: Secondary | ICD-10-CM | POA: Diagnosis present

## 2016-09-23 DIAGNOSIS — O26891 Other specified pregnancy related conditions, first trimester: Secondary | ICD-10-CM | POA: Diagnosis not present

## 2016-09-23 DIAGNOSIS — R1900 Intra-abdominal and pelvic swelling, mass and lump, unspecified site: Secondary | ICD-10-CM | POA: Diagnosis not present

## 2016-09-23 LAB — URINALYSIS, ROUTINE W REFLEX MICROSCOPIC
Bilirubin Urine: NEGATIVE
Glucose, UA: NEGATIVE mg/dL
KETONES UR: NEGATIVE mg/dL
LEUKOCYTES UA: NEGATIVE
NITRITE: NEGATIVE
PROTEIN: NEGATIVE mg/dL
Specific Gravity, Urine: 1.015 (ref 1.005–1.030)
pH: 5.5 (ref 5.0–8.0)

## 2016-09-23 LAB — HCG, QUANTITATIVE, PREGNANCY: hCG, Beta Chain, Quant, S: 12981 m[IU]/mL — ABNORMAL HIGH (ref <5)

## 2016-09-23 LAB — CBC
HEMATOCRIT: 34.5 % — AB (ref 36.0–46.0)
Hemoglobin: 12 g/dL (ref 12.0–15.0)
MCH: 29.9 pg (ref 26.0–34.0)
MCHC: 34.8 g/dL (ref 30.0–36.0)
MCV: 85.8 fL (ref 78.0–100.0)
PLATELETS: 205 10*3/uL (ref 150–400)
RBC: 4.02 MIL/uL (ref 3.87–5.11)
RDW: 14.4 % (ref 11.5–15.5)
WBC: 11.9 10*3/uL — AB (ref 4.0–10.5)

## 2016-09-23 LAB — WET PREP, GENITAL
CLUE CELLS WET PREP: NONE SEEN
Sperm: NONE SEEN
TRICH WET PREP: NONE SEEN
Yeast Wet Prep HPF POC: NONE SEEN

## 2016-09-23 LAB — POCT PREGNANCY, URINE
PREG TEST UR: NEGATIVE
PREG TEST UR: POSITIVE — AB

## 2016-09-23 LAB — URINE MICROSCOPIC-ADD ON
BACTERIA UA: NONE SEEN
WBC UA: NONE SEEN WBC/hpf (ref 0–5)

## 2016-09-23 LAB — ABO/RH: ABO/RH(D): A POS

## 2016-09-23 NOTE — Discharge Instructions (Signed)

## 2016-09-23 NOTE — MAU Note (Signed)
Pt presents to MAU with complaints of lower abdominal cramping with small amount of vaginal bleeding after intercourse this morning

## 2016-09-23 NOTE — MAU Provider Note (Signed)
Chief Complaint: No chief complaint on file.   First Provider Initiated Contact with Patient 09/23/16 1651        SUBJECTIVE HPI: Karina Nelson is a 25 y.o. G1P0 at [redacted]w[redacted]d by LMP who presents to maternity admissions reporting bleeding after intercourse today.  Has some cramping. [facility].  They told her they would do an [place] in 2 weeks. .She denies vaginal itching/burning, urinary symptoms, h/a, dizziness, n/v, or fever/chills.    Vaginal Bleeding  The patient's primary symptoms include pelvic pain and vaginal bleeding. The patient's pertinent negatives include no genital itching, genital lesions, genital odor or vaginal discharge. This is a new problem. The current episode started today. The problem occurs intermittently. The problem has been waxing and waning. The pain is mild. The problem affects both sides. She is pregnant. Associated symptoms include abdominal pain (cramping). Pertinent negatives include no constipation, diarrhea, fever, headaches, nausea or vomiting. The vaginal discharge was bloody. The vaginal bleeding is spotting. She has not been passing clots. She has not been passing tissue. The symptoms are aggravated by intercourse. She has tried nothing for the symptoms. She is sexually active. She uses nothing for contraception.   RN Note: Pt presents to [facility]U with complaints of lower abdominal cramping with small amount of vaginal bleeding after intercourse this morning  Past Medical History:  Diagnosis Date  . Anemia   . Asthma   . Epilepsy (HCC)   . Seasonal allergies    Past Surgical History:  Procedure Laterality Date  . OTHER SURGICAL HISTORY     cyst removal behind left ear   Social History   Social History  . Marital status: Single    Spouse name: N/A  . Number of children: N/A  . Years of education: N/A   Occupational History  . Warehouse Worker    Social History Main Topics  . Smoking status: Former Smoker     Types: Cigars  . Smokeless tobacco: Never Used     Comment: 1-2 cigars a day  . Alcohol use 0.0 oz/week     Comment: Patient drinks alcohol occasionally on the weekend only.  . Drug use: No  . Sexual activity: Yes   Other Topics Concern  . Not on file   Social History Narrative  . No narrative on file   No current facility-administered medications on file prior to encounter.    Current Outpatient Prescriptions on File Prior to Encounter  Medication Sig Dispense Refill  . cetirizine (ZYRTEC) 10 MG tablet Take 10 mg by mouth as needed.     . clindamycin (CLEOCIN) 300 MG capsule Take 1 capsule (300 mg total) by mouth 4 (four) times daily. X 7 days 28 capsule 0  . Diclofenac Sodium CR (VOLTAREN-XR) 100 MG 24 hr tablet Take 1 tablet (100 mg total) by mouth daily. 10 tablet 0  . lamoTRIgine (LAMICTAL) 100 MG tablet TAKE 2 TABLETS EACH MORNING AND 2 TABLETS AT BEDTIME. 360 tablet 3  . naproxen (NAPROSYN) 500 MG tablet Take 1 tablet (500 mg total) by mouth 2 (two) times daily. 30 tablet 0   Allergies  Allergen Reactions  . Celery Oil Anaphylaxis  . Rice Anaphylaxis  . Strawberry Extract Anaphylaxis  . Pineapple Itching  . Sudafed [Pseudoephedrine] Other (See Comments)    Dizziness, weakness  . Banana Itching    I have reviewed patient's Past Medical Hx, Surgical Hx, Family Hx, Social Hx, medications and allergies.  ROS:  Review of Systems  Constitutional: Negative for fever.  Gastrointestinal: Positive for abdominal pain (cramping). Negative for constipation, diarrhea, nausea and vomiting.  Genitourinary: Positive for pelvic pain and vaginal bleeding. Negative for vaginal discharge.  Neurological: Negative for headaches.   Other systems negative   Physical Exam  Patient Vitals for the past 24 hrs:  BP Temp Pulse Resp  09/23/16 1622 125/81 98.9 F (37.2 C) 106 18    Physical Exam  Constitutional: Well-developed, well-nourished female in no acute distress.   Cardiovascular: normal rate Respiratory: normal effort GI: Abd soft, non-tender. Pos BS x 4 MS: Extremities nontender, no edema, normal ROM Neurologic: Alert and oriented x 4.  GU: Neg CVAT.  PELVIC EXAM: Cervix pink, visually closed, without lesion, scant pink discharge, vaginal walls and external genitalia normal Bimanual exam: Cervix 0/long/high, firm, anterior, neg CMT, uterus nontender, nonenlarged, adnexa without tenderness, enlargement, or mass   LAB RESULTS Results for orders placed or performed during the hospital encounter of 09/23/16 (from the past 24 hour(s))  Urinalysis, Routine w reflex microscopic (not at [facility])     Status: Abnormal   Collection Time: 09/23/16  4:00 PM  Result Value Ref Range   Color, Urine YELLOW YELLOW   APPearance CLEAR CLEAR   Specific Gravity, Urine 1.015 1.005 - 1.030   pH 5.5 5.0 - 8.0   Glucose, UA NEGATIVE NEGATIVE mg/dL   Hgb urine dipstick MODERATE (A) NEGATIVE   Bilirubin Urine NEGATIVE NEGATIVE   Ketones, ur NEGATIVE NEGATIVE mg/dL   Protein, ur NEGATIVE NEGATIVE mg/dL   Nitrite NEGATIVE NEGATIVE   Leukocytes, UA NEGATIVE NEGATIVE  Urine microscopic-add on     Status: Abnormal   Collection Time: 09/23/16  4:00 PM  Result Value Ref Range   Squamous Epithelial / LPF 0-5 (A) NONE SEEN   WBC, UA NONE SEEN 0 - 5 WBC/hpf   RBC / HPF 0-5 0 - 5 RBC/hpf   Bacteria, UA NONE SEEN NONE SEEN  Pregnancy, urine POC     Status: Abnormal   Collection Time: 09/23/16  4:19 PM  Result Value Ref Range   Preg Test, Ur POSITIVE (A) NEGATIVE  CBC     Status: Abnormal   Collection Time: 09/23/16  4:23 PM  Result Value Ref Range   WBC 11.9 (H) 4.0 - 10.5 K/uL   RBC 4.02 3.87 - 5.11 MIL/uL   Hemoglobin 12.0 12.0 - 15.0 g/dL   HCT 45.434.5 (L) 09.836.0 - 11.946.0 %   MCV 85.8 78.0 - 100.0 fL   MCH 29.9 26.0 - 34.0 pg   MCHC 34.8 30.0 - 36.0 g/dL   RDW 14.714.4 82.911.5 - 56.215.5 %   Platelets 205 150 - 400 K/uL  hCG, quantitative, pregnancy     Status: Abnormal    Collection Time: 09/23/16  4:23 PM  Result Value Ref Range   hCG, Beta Chain, Quant, S 12,981 (H) <5 mIU/mL  ABO/Rh     Status: None (Preliminary result)   Collection Time: 09/23/16  4:23 PM  Result Value Ref Range   ABO/RH(D) A POS   Pregnancy, urine POC     Status: None   Collection Time: 09/23/16  4:25 PM  Result Value Ref Range   Preg Test, Ur NEGATIVE NEGATIVE  Wet prep, genital     Status: Abnormal   Collection Time: 09/23/16  4:58 PM  Result Value Ref Range   Yeast Wet Prep HPF POC NONE SEEN NONE SEEN   Trich, Wet Prep NONE SEEN  NONE SEEN   Clue Cells Wet Prep HPF POC NONE SEEN NONE SEEN   WBC, Wet Prep HPF POC RARE (A) NONE SEEN   Sperm NONE SEEN        IMAGING US Ob Comp Less 14 Wks  Result Date: 09/23/2016 CLINICAL DATA:  Vaginal spotting EXAM: OBSTETRIC <14 WK [place] AND TRANSVAGINAL OB US TECHNIQUE: Both transabdominal and transvaginal ultrasound examinations were performed for complete evaluation of the gestation as well as the maternal uterus, adnexal regions, and pelvic cul-de-sac. Transvaginal technique was performed to assess early pregnancy. COMPARISON:  None. FINDINGS: Intrauterine gestational sac: Visualized. There is debris within the gestational sac. Yolk sac:  Not visualized Embryo:  Not visualized Cardiac Activity: Not visualized MSD: 15  mm   6 w   2  d Subchorionic hemorrhage:  None visualized. Maternal uterus/adnexae: Cervical os is closed. Left ovary appears normal. There is a septated cystic right adnexal mass measuring 6.1 x 4.5 x 6.2 cm. There is moderate free fluid in the cul-de-sac. IMPRESSION: There is a gestational sac measuring 15 mm in size which contains debris. No fetal pole seen. Findings are suspicious but not yet definitive for failed pregnancy. Recommend follow-up US in 10-14 days for definitive diagnosis. This recommendation follows SRU consensus guidelines: Diagnostic Criteria for Nonviable Pregnancy Early in the First Trimester. Malva Limes Med 2013;  161:0960-45. Based on gestational sac size, estimated gestational age is 6+ weeks. Complex multi-septated right adnexal mass with moderate free pelvic fluid. Suspect hemorrhagic cyst with recent rupture/leakage. Cystic ovarian neoplasm could present in this manner and is not excluded by this study. Particular attention to this area on subsequent pelvic ultrasound evaluation advised. Electronically Signed   By: Bretta Bang III M.D.   On: 09/23/2016 18:39   US Ob Transvaginal  Result Date: 09/23/2016 CLINICAL DATA:  Vaginal spotting EXAM: OBSTETRIC <14 WK [place] AND TRANSVAGINAL OB US TECHNIQUE: Both transabdominal and transvaginal ultrasound examinations were performed for complete evaluation of the gestation as well as the maternal uterus, adnexal regions, and pelvic cul-de-sac. Transvaginal technique was performed to assess early pregnancy. COMPARISON:  None. FINDINGS: Intrauterine gestational sac: Visualized. There is debris within the gestational sac. Yolk sac:  Not visualized Embryo:  Not visualized Cardiac Activity: Not visualized MSD: 15  mm   6 w   2  d Subchorionic hemorrhage:  None visualized. Maternal uterus/adnexae: Cervical os is closed. Left ovary appears normal. There is a septated cystic right adnexal mass measuring 6.1 x 4.5 x 6.2 cm. There is moderate free fluid in the cul-de-sac. IMPRESSION: There is a gestational sac measuring 15 mm in size which contains debris. No fetal pole seen. Findings are suspicious but not yet definitive for failed pregnancy. Recommend follow-up US in 10-14 days for definitive diagnosis. This recommendation follows SRU consensus guidelines: Diagnostic Criteria for Nonviable Pregnancy Early in the First Trimester. Malva Limes Med 2013; 409:8119-14. Based on gestational sac size, estimated gestational age is 6+ weeks. Complex multi-septated right adnexal mass with moderate free pelvic fluid. Suspect hemorrhagic cyst with recent rupture/leakage. Cystic ovarian neoplasm  could present in this manner and is not excluded by this study. Particular attention to this area on subsequent pelvic ultrasound evaluation advised. Electronically Signed   By: Bretta Bang III M.D.   On: 09/23/2016 18:39    MAU Management/MDM: Ordered usual first trimester r/o ectopic labs.   Pelvic exam and cultures done Will check baseline Ultrasound to rule out ectopic.  Consult Dr Despina Hidden with presentation,  exam findings, and results.   He states since quant is so high and US showed what it did, he recommends repeat quant on Wed, and expects it will go down.  If it does not, then would repeat US in a week.    This bleeding/pain can represent a normal pregnancy with bleeding, spontaneous abortion or even an ectopic which can be life-threatening.  The process as listed above helps to determine which of these is present.    ASSESSMENT 1. Bleeding in early pregnancy   2. Bleeding in early pregnancy     PLAN Discharge home Plan to repeat HCG level in 48 hours in clinic per 11:00 am schedule Will repeat  Ultrasound in about 7-10 days if HCG levels double appropriately     Pt stable at time of discharge. Encouraged to return here or to other Urgent Care/ED if she develops worsening of symptoms, increase in pain, fever, or other concerning symptoms.    Wynelle Bourgeois CNM, MSN Certified Nurse-Midwife 09/23/2016  5:01 PM

## 2016-09-24 LAB — GC/CHLAMYDIA PROBE AMP (~~LOC~~) NOT AT ARMC
CHLAMYDIA, DNA PROBE: NEGATIVE
Neisseria Gonorrhea: NEGATIVE

## 2016-09-24 LAB — HIV ANTIBODY (ROUTINE TESTING W REFLEX): HIV Screen 4th Generation wRfx: NONREACTIVE

## 2016-09-26 ENCOUNTER — Inpatient Hospital Stay (HOSPITAL_COMMUNITY): Payer: Medicaid Other

## 2016-09-26 ENCOUNTER — Inpatient Hospital Stay (HOSPITAL_COMMUNITY)
Admission: AD | Admit: 2016-09-26 | Discharge: 2016-09-26 | Disposition: A | Discharge status: Home / Self Care | Payer: Medicaid Other | Source: Ambulatory Visit | Attending: Family Medicine | Admitting: Family Medicine

## 2016-09-26 ENCOUNTER — Other Ambulatory Visit: Payer: Self-pay

## 2016-09-26 DIAGNOSIS — O0281 Inappropriate change in quantitative human chorionic gonadotropin (hCG) in early pregnancy: Secondary | ICD-10-CM | POA: Diagnosis not present

## 2016-09-26 DIAGNOSIS — E349 Endocrine disorder, unspecified: Secondary | ICD-10-CM | POA: Diagnosis not present

## 2016-09-26 DIAGNOSIS — O3680X0 Pregnancy with inconclusive fetal viability, not applicable or unspecified: Secondary | ICD-10-CM

## 2016-09-26 DIAGNOSIS — Z87891 Personal history of nicotine dependence: Secondary | ICD-10-CM | POA: Insufficient documentation

## 2016-09-26 DIAGNOSIS — Z3A01 Less than 8 weeks gestation of pregnancy: Secondary | ICD-10-CM | POA: Insufficient documentation

## 2016-09-26 DIAGNOSIS — Z3491 Encounter for supervision of normal pregnancy, unspecified, first trimester: Secondary | ICD-10-CM

## 2016-09-26 DIAGNOSIS — O26891 Other specified pregnancy related conditions, first trimester: Secondary | ICD-10-CM | POA: Insufficient documentation

## 2016-09-26 LAB — HCG, QUANTITATIVE, PREGNANCY: hCG, Beta Chain, Quant, S: 22300 m[IU]/mL — ABNORMAL HIGH (ref <5)

## 2016-09-26 NOTE — Discharge Instructions (Signed)
First Trimester of Pregnancy The first trimester of pregnancy is from week 1 until the end of week 12 (months 1 through 3). A week after a sperm fertilizes an egg, the egg will implant on the wall of the uterus. This embryo will begin to develop into a baby. Genes from you and your partner are forming the baby. The female genes determine whether the baby is a boy or a girl. At 6-8 weeks, the eyes and face are formed, and the heartbeat can be seen on ultrasound. At the end of 12 weeks, all the baby's organs are formed.  Now that you are pregnant, you will want to do everything you can to have a healthy baby. Two of the most important things are to get good prenatal care and to follow your health care provider's instructions. Prenatal care is all the medical care you receive before the baby's birth. This care will help prevent, find, and treat any problems during the pregnancy and childbirth. BODY CHANGES Your body goes through many changes during pregnancy. The changes vary from woman to woman.   You may gain or lose a couple of pounds at first.  You may feel sick to your stomach (nauseous) and throw up (vomit). If the vomiting is uncontrollable, call your health care provider.  You may tire easily.  You may develop headaches that can be relieved by medicines approved by your health care provider.  You may urinate more often. Painful urination may mean you have a bladder infection.  You may develop heartburn as a result of your pregnancy.  You may develop constipation because certain hormones are causing the muscles that push waste through your intestines to slow down.  You may develop hemorrhoids or swollen, bulging veins (varicose veins).  Your breasts may begin to grow larger and become tender. Your nipples may stick out more, and the tissue that surrounds them (areola) may become darker.  Your gums may bleed and may be sensitive to brushing and flossing.  Dark spots or blotches (chloasma,  mask of pregnancy) may develop on your face. This will likely fade after the baby is born.  Your menstrual periods will stop.  You may have a loss of appetite.  You may develop cravings for certain kinds of food.  You may have changes in your emotions from day to day, such as being excited to be pregnant or being concerned that something may go wrong with the pregnancy and baby.  You may have more vivid and strange dreams.  You may have changes in your hair. These can include thickening of your hair, rapid growth, and changes in texture. Some women also have hair loss during or after pregnancy, or hair that feels dry or thin. Your hair will most likely return to normal after your baby is born. WHAT TO EXPECT AT YOUR PRENATAL VISITS During a routine prenatal visit:  You will be weighed to make sure you and the baby are growing normally.  Your blood pressure will be taken.  Your abdomen will be measured to track your baby's growth.  The fetal heartbeat will be listened to starting around week 10 or 12 of your pregnancy.  Test results from any previous visits will be discussed. Your health care provider may ask you:  How you are feeling.  If you are feeling the baby move.  If you have had any abnormal symptoms, such as leaking fluid, bleeding, severe headaches, or abdominal cramping.  If you are using any tobacco products,   including cigarettes, chewing tobacco, and electronic cigarettes.  If you have any questions. Other tests that may be performed during your first trimester include:  Blood tests to find your blood type and to check for the presence of any previous infections. They will also be used to check for low iron levels (anemia) and Rh antibodies. Later in the pregnancy, blood tests for diabetes will be done along with other tests if problems develop.  Urine tests to check for infections, diabetes, or protein in the urine.  An ultrasound to confirm the proper growth  and development of the baby.  An amniocentesis to check for possible genetic problems.  Fetal screens for spina bifida and Down syndrome.  You may need other tests to make sure you and the baby are doing well.  HIV (human immunodeficiency virus) testing. Routine prenatal testing includes screening for HIV, unless you choose not to have this test. HOME CARE INSTRUCTIONS  Medicines  Follow your health care provider's instructions regarding medicine use. Specific medicines may be either safe or unsafe to take during pregnancy.  Take your prenatal vitamins as directed.  If you develop constipation, try taking a stool softener if your health care provider approves. Diet  Eat regular, well-balanced meals. Choose a variety of foods, such as meat or vegetable-based protein, fish, milk and low-fat dairy products, vegetables, fruits, and whole grain breads and cereals. Your health care provider will help you determine the amount of weight gain that is right for you.  Avoid raw meat and uncooked cheese. These carry germs that can cause birth defects in the baby.  Eating four or five small meals rather than three large meals a day may help relieve nausea and vomiting. If you start to feel nauseous, eating a few soda crackers can be helpful. Drinking liquids between meals instead of during meals also seems to help nausea and vomiting.  If you develop constipation, eat more high-fiber foods, such as fresh vegetables or fruit and whole grains. Drink enough fluids to keep your urine clear or pale yellow. Activity and Exercise  Exercise only as directed by your health care provider. Exercising will help you:  Control your weight.  Stay in shape.  Be prepared for labor and delivery.  Experiencing pain or cramping in the lower abdomen or low back is a good sign that you should stop exercising. Check with your health care provider before continuing normal exercises.  Try to avoid standing for long  periods of time. Move your legs often if you must stand in one place for a long time.  Avoid heavy lifting.  Wear low-heeled shoes, and practice good posture.  You may continue to have sex unless your health care provider directs you otherwise. Relief of Pain or Discomfort  Wear a good support bra for breast tenderness.   Take warm sitz baths to soothe any pain or discomfort caused by hemorrhoids. Use hemorrhoid cream if your health care provider approves.   Rest with your legs elevated if you have leg cramps or low back pain.  If you develop varicose veins in your legs, wear support hose. Elevate your feet for 15 minutes, 3-4 times a day. Limit salt in your diet. Prenatal Care  Schedule your prenatal visits by the twelfth week of pregnancy. They are usually scheduled monthly at first, then more often in the last 2 months before delivery.  Write down your questions. Take them to your prenatal visits.  Keep all your prenatal visits as directed by your   health care provider. Safety  Wear your seat belt at all times when driving.  Make a list of emergency phone numbers, including numbers for family, friends, the hospital, and police and fire departments. General Tips  Ask your health care provider for a referral to a local prenatal education class. Begin classes no later than at the beginning of month 6 of your pregnancy.  Ask for help if you have counseling or nutritional needs during pregnancy. Your health care provider can offer advice or refer you to specialists for help with various needs.  Do not use hot tubs, steam rooms, or saunas.  Do not douche or use tampons or scented sanitary pads.  Do not cross your legs for long periods of time.  Avoid cat litter boxes and soil used by cats. These carry germs that can cause birth defects in the baby and possibly loss of the fetus by miscarriage or stillbirth.  Avoid all smoking, herbs, alcohol, and medicines not prescribed by  your health care provider. Chemicals in these affect the formation and growth of the baby.  Do not use any tobacco products, including cigarettes, chewing tobacco, and electronic cigarettes. If you need help quitting, ask your health care provider. You may receive counseling support and other resources to help you quit.  Schedule a dentist appointment. At home, brush your teeth with a soft toothbrush and be gentle when you floss. SEEK MEDICAL CARE IF:   You have dizziness.  You have mild pelvic cramps, pelvic pressure, or nagging pain in the abdominal area.  You have persistent nausea, vomiting, or diarrhea.  You have a bad smelling vaginal discharge.  You have pain with urination.  You notice increased swelling in your face, hands, legs, or ankles. SEEK IMMEDIATE MEDICAL CARE IF:   You have a fever.  You are leaking fluid from your vagina.  You have spotting or bleeding from your vagina.  You have severe abdominal cramping or pain.  You have rapid weight gain or loss.  You vomit blood or material that looks like coffee grounds.  You are exposed to German measles and have never had them.  You are exposed to fifth disease or chickenpox.  You develop a severe headache.  You have shortness of breath.  You have any kind of trauma, such as from a fall or a car accident.   This information is not intended to replace advice given to you by your health care provider. Make sure you discuss any questions you have with your health care provider.   Document Released: 12/11/2001 Document Revised: 01/07/2015 Document Reviewed: 10/27/2013 Elsevier Interactive Patient Education 2016 [employer]  

## 2016-09-26 NOTE — Progress Notes (Signed)
Patient present to office today for repeat quant. Patient stated she is not having any pain at this time. Per Dr.Pickens patient should be sent to [facility]U to r/o ectopic pregnancy due to her quant levels. Patient left the office and she was contact to go directly to MAU. Patient verbalizes understanding at this time.

## 2016-09-26 NOTE — MAU Provider Note (Signed)
History   [id number]   Chief Complaint  Patient presents with  . Follow-up    HPI [place] Lyerly is a 25 y.o. female  G1P0 here with for follow-up ultrasound.  Upon review of the records patient was first seen on 9/24 for vaginal bleeding. BHCG on that day was 12,981. Ultrasound showed IUGS, no yolk sac, and a 6 cm right ovarian cyst. GC/CT and wet prep were collected.  Results were negative. Pt discharged home [facility] BHCG today 22,300. PT sent here for ultrasound per Dr. Vergie LivingPickens. Patient denies abdominal pain or vaginal bleeding.   Patient's last menstrual period was 08/13/2016.  OB History  Gravida Para Term Preterm AB Living  1            SAB TAB Ectopic Multiple Live Births               # Outcome Date GA Lbr Len/2nd Weight Sex Delivery Anes PTL Lv  1 Current               Past Medical History:  Diagnosis Date  . Anemia   . Asthma   . Epilepsy (HCC)   . Seasonal allergies     Family History  Problem Relation Age of Onset  . Hypertension Mother   . Cancer Father   . Heart disease Father   . Diabetes Paternal Grandfather   . Cancer Paternal Aunt     Social History   Social History  . Marital status: Single    Spouse name: N/A  . Number of children: N/A  . Years of education: N/A   Occupational History  . Warehouse Worker    Social History Main Topics  . Smoking status: Former Smoker    Types: Cigars  . Smokeless tobacco: Never Used     Comment: 1-2 cigars a day  . Alcohol use 0.0 oz/week     Comment: Patient drinks alcohol occasionally on the weekend only.  . Drug use: No  . Sexual activity: Yes   Other Topics Concern  . Not on file   Social History Narrative  . No narrative on file    Allergies  Allergen Reactions  . Celery Oil Anaphylaxis  . Rice Anaphylaxis  . Strawberry Extract Anaphylaxis  . Pineapple Itching  . Sudafed [Pseudoephedrine] Other (See Comments)    Dizziness, weakness  . Banana Itching     No current facility-administered medications on file prior to encounter.    Current Outpatient Prescriptions on File Prior to Encounter  Medication Sig Dispense Refill  . cetirizine (ZYRTEC) 10 MG tablet Take 10 mg by mouth as needed.     . clindamycin (CLEOCIN) 300 MG capsule Take 1 capsule (300 mg total) by mouth 4 (four) times daily. X 7 days 28 capsule 0  . lamoTRIgine (LAMICTAL) 100 MG tablet TAKE 2 TABLETS EACH MORNING AND 2 TABLETS AT BEDTIME. 360 tablet 3     Physical Exam   Vitals:   09/26/16 1355  BP: 103/63  Pulse: 91  Resp: 18  Temp: 98.6 F (37 C)  TempSrc: Oral  Weight: 230 lb 3.2 oz (104.4 kg)  Height: 5' 4.75" (1.645 m)    Physical Exam  Nursing note and vitals reviewed. Constitutional: She is oriented to person, place, and time. She appears well-developed and well-nourished. No distress.  HENT:  Head: Normocephalic and atraumatic.  Eyes: Conjunctivae are normal. Right eye exhibits no discharge. Left eye exhibits no discharge. No  scleral icterus.  Neck: Normal range of motion.  Respiratory: Effort normal. No respiratory distress.  GI: Soft. There is no tenderness. There is no rebound and no guarding.  Musculoskeletal: Normal range of motion.  Neurological: She is alert and oriented to person, place, and time.  Skin: Skin is warm and dry. She is not diaphoretic.  Psychiatric: She has a normal mood and affect. Her behavior is normal. Judgment and thought content normal.    MAU Course  Procedures US Ob Transvaginal  Result Date: 09/26/2016 CLINICAL DATA:  25 year old pregnant female presents for follow-up of gestational viability. Rising beta HCG, 22,309 09/26/2016 and 16109 on 09/23/2016. EDC by LMP: 05/20/2017, projecting to an expected gestational age of [redacted] weeks 2 days. EXAM: TRANSVAGINAL OB ULTRASOUND TECHNIQUE: Transvaginal ultrasound was performed for complete evaluation of the gestation as well as the maternal uterus, adnexal regions, and pelvic  cul-de-sac. COMPARISON:  09/23/2016 obstetric scan. FINDINGS: Intrauterine gestational sac: Single intrauterine gestational sac appears normal in size, shape and position. Yolk sac:  Present. Embryo:  Present. Embryonic Cardiac Activity: Regular rate and rhythm. Embryonic Heart Rate: 115 bpm CRL:   2.5  mm   5 w 5 d                  [place] EDC: 05/24/2017 Subchorionic hemorrhage:  None visualized. Maternal uterus/adnexae: Left ovary measures 1.4 x 0.6 x 0.5 cm. Right ovary measures 3.5 x 1.7 x 3.4 cm and contains a 2.6 cm corpus luteum (which has decreased in size from 6.1 cm on 09/23/2016). No suspicious ovarian or adnexal masses. No abnormal free fluid in the pelvis. IMPRESSION: 1. Single living intrauterine gestation at 5 weeks 5 days by crown-rump length, with no significant discrepancy with the expected gestational age of [redacted] weeks 2 days by provided menstrual dating. Normal embryonic cardiac activity. 2. No suspicious ovarian or adnexal findings. Right ovarian corpus luteum has significantly decreased in size in the interval. Electronically Signed   By: Delbert Phenix M.D.   On: 09/26/2016 14:40    MDM Ultrasound shows SIUP with cardiac activity. Right CLC now measuring 2.6 cm  Assessment and Plan  26 y.o. G1P0 at [redacted]w[redacted]d IUP  1. Normal IUP (intrauterine pregnancy) on prenatal ultrasound, first trimester   2. Elevated serum hCG     P: Discharge home Start prenatal care Discussed reasons to return to MAU  Judeth Horn, NP 09/26/2016 2:52 PM

## 2016-09-26 NOTE — MAU Note (Signed)
Sent up from clinic for US to r/o ectopic.  Denies pain or bleeding.  Doing ok

## 2016-10-12 ENCOUNTER — Encounter (HOSPITAL_COMMUNITY): Payer: Self-pay | Admitting: *Deleted

## 2016-10-12 DIAGNOSIS — Z79899 Other long term (current) drug therapy: Secondary | ICD-10-CM | POA: Insufficient documentation

## 2016-10-12 DIAGNOSIS — K0889 Other specified disorders of teeth and supporting structures: Secondary | ICD-10-CM | POA: Diagnosis present

## 2016-10-12 DIAGNOSIS — Z87891 Personal history of nicotine dependence: Secondary | ICD-10-CM | POA: Diagnosis not present

## 2016-10-12 DIAGNOSIS — J45909 Unspecified asthma, uncomplicated: Secondary | ICD-10-CM | POA: Insufficient documentation

## 2016-10-12 NOTE — ED Triage Notes (Signed)
Patient is alert and oriented x4.  She is complaining of left lower jaw pain that started earlier this week.  Patient states that she has had this issue in the past.  Currently she rates her pain 9 of 10.

## 2016-10-13 ENCOUNTER — Emergency Department (HOSPITAL_COMMUNITY)
Admission: EM | Admit: 2016-10-13 | Discharge: 2016-10-13 | Disposition: A | Discharge status: Home / Self Care | Payer: Medicaid Other | Attending: Emergency Medicine | Admitting: Emergency Medicine

## 2016-10-13 DIAGNOSIS — K0889 Other specified disorders of teeth and supporting structures: Secondary | ICD-10-CM

## 2016-10-13 MED ORDER — AMOXICILLIN 500 MG PO CAPS
500.0000 mg | ORAL_CAPSULE | Freq: Once | ORAL | Status: AC
  Administered 2016-10-13: 500 mg via ORAL
  Filled 2016-10-13: qty 1

## 2016-10-13 MED ORDER — HYDROCODONE-ACETAMINOPHEN 5-325 MG PO TABS: 1.0000 | ORAL_TABLET | Freq: Three times a day (TID) | ORAL | 0 refills | Status: DC | PRN

## 2016-10-13 MED ORDER — AMOXICILLIN 500 MG PO CAPS: 500.0000 mg | ORAL_CAPSULE | Freq: Three times a day (TID) | ORAL | 0 refills | Status: DC

## 2016-10-13 NOTE — Discharge Instructions (Signed)
Take Tylenol or Norco for pain. Do not take Tylenol with Norco as there is already Tylenol in this medication. Discussed use of Norco with your OB/GYN before taking. Take amoxicillin as prescribed until completed. Follow-up with your dentist.

## 2016-10-13 NOTE — ED Notes (Signed)
Pt has dental pain on the lower left portion of her jaw. Pt denies clicking in her jaw and denies fevers or chills, but thinks she has a cavity

## 2016-10-15 NOTE — ED Provider Notes (Signed)
[facility]-EMERGENCY DEPT Provider Note   CSN: [id number] Arrival date & time: 10/12/16  2206     History   Chief Complaint Chief Complaint  Patient presents with  . Jaw Pain    left side    HPI Karina Nelson Karina Nelson is a 25 y.o. female.  Patient is a 25 y/o female with hx of epilepsy, asthma, and anemia, currently pregnant, who presents to the ED for evaluation of left lower dentalgia. Symptoms have been worsening x 1 week. She has been managing pain at home with tylenol. She states that this provided little relief of pain today. Patient reports having a "bad tooth". She is scheduled to see her dentist in 1 week and reports needing to have the tooth pulled. She denies inability to open her jaw, fever, difficulty swallowing, and neck stiffness.   The history is provided by the patient. No language interpreter was used.    Past Medical History:  Diagnosis Date  . Anemia   . Asthma   . Epilepsy (HCC)   . Seasonal allergies     Patient Active Problem List   Diagnosis Date Noted  . Localization-related (focal) (partial) idiopathic epilepsy and epileptic syndromes with seizures of localized onset, not intractable, without status epilepticus 11/17/2015  . Frequent nosebleeds 09/21/2014  . Insomnia 03/09/2014  . 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 Encounter for long-term (current) use of other medications 07/08/2013  . Morbid obesity (HCC) 07/08/2013  . Attention deficit disorder with hyperactivity(314.01) 07/08/2013  . Migraine without aura 07/08/2013    Past Surgical History:  Procedure Laterality Date  . OTHER SURGICAL HISTORY     cyst removal behind left ear    OB History    Gravida Para Term Preterm AB Living   1             SAB TAB Ectopic Multiple Live Births                   Home Medications    Prior to Admission medications   Medication Sig Start Date End Date Taking? Authorizing Provider  amoxicillin (AMOXIL) 500 MG capsule Take 1 capsule (500 mg  total) by mouth 3 (three) times daily. 10/13/16   Antony Madura, PA-C  cetirizine (ZYRTEC) 10 MG tablet Take 10 mg by mouth as needed.     Historical Provider, MD  HYDROcodone-acetaminophen (NORCO/VICODIN) 5-325 MG tablet Take 1 tablet by mouth every 8 (eight) hours as needed. 10/13/16   Antony Madura, PA-C  lamoTRIgine (LAMICTAL) 100 MG tablet TAKE 2 TABLETS EACH MORNING AND 2 TABLETS AT BEDTIME. 11/17/15   Van Clines, MD    Family History Family History  Problem Relation Age of Onset  . Hypertension Mother   . Cancer Father   . Heart disease Father   . Diabetes Paternal Grandfather   . Cancer Paternal Aunt     Social History Social History  Substance Use Topics  . Smoking status: Former Smoker    Types: Cigars  . Smokeless tobacco: Never Used     Comment: 1-2 cigars a day  . Alcohol use 0.0 oz/week     Comment: patient stopped drinking due to pregnancy     Allergies   Celery oil; Rice; Strawberry extract; Pineapple; Sudafed [pseudoephedrine]; and Banana   Review of Systems Review of Systems Ten systems reviewed and are negative for acute change, except as noted in the HPI.    Physical Exam Updated Vital Signs BP 111/55   Pulse 68  Temp 98.5 F (36.9 C) (Oral)   Resp 18   Ht 5\' 5"  (1.651 m)   Wt 104.3 kg   LMP 08/13/2016   SpO2 100%   BMI 38.27 kg/m   Physical Exam  Constitutional: She is oriented to person, place, and time. She appears well-developed and well-nourished. No distress.  Patient alert, pleasant, in no distress.  HENT:  Head: Normocephalic and atraumatic.  TTP to the left lower second molar. Associated dental caries noted. No gingival fluctuance. No trismus. Soft oral floor.  Eyes: Conjunctivae and EOM are normal. No scleral icterus.  Neck: Normal range of motion.  No nuchal rigidity or meningismus.  Cardiovascular: Normal rate, regular rhythm and intact distal pulses.   Pulmonary/Chest: Effort normal. No respiratory distress.  Respirations  even and unlabored  Musculoskeletal: Normal range of motion.  Neurological: She is alert and oriented to person, place, and time. She exhibits normal muscle tone. Coordination normal.  Skin: Skin is warm and dry. No rash noted. She is not diaphoretic. No erythema. No pallor.  Psychiatric: She has a normal mood and affect. Her behavior is normal.  Nursing note and vitals reviewed.    ED Treatments / Results  Labs (all labs ordered are listed, but only abnormal results are displayed) Labs Reviewed - No data to display  EKG  EKG Interpretation None       Radiology No results found.  Procedures Procedures (including critical care time)  Medications Ordered in ED Medications  amoxicillin (AMOXIL) capsule 500 mg (500 mg Oral Given 10/13/16 0225)     Initial Impression / Assessment and Plan / ED Course  I have reviewed the triage vital signs and the nursing notes.  Pertinent labs & imaging results that were available during my care of the patient were reviewed by me and considered in my medical decision making (see chart for details).  Clinical Course    Patient with toothache. No gross abscess. Exam unconcerning for Ludwig's angina or spread of infection. Will treat with Amoxicillin and Norco; however, patient instructed to call her OBGYN to discuss use of Norco prior to taking given that she is pregnant. I have also urged patient to follow-up with dentist. Patient reports outpatient f/u in 1 week. Return precautions discussed and provided. Patient discharged in stable condition with no unaddressed concerns.   Final Clinical Impressions(s) / ED Diagnoses   Final diagnoses:  Dentalgia    New Prescriptions Discharge Medication List as of 10/13/2016  2:15 AM    START taking these medications   Details  amoxicillin (AMOXIL) 500 MG capsule Take 1 capsule (500 mg total) by mouth 3 (three) times daily., Starting Sat 10/13/2016, Print    HYDROcodone-acetaminophen  (NORCO/VICODIN) 5-325 MG tablet Take 1 tablet by mouth every 8 (eight) hours as needed., Starting Sat 10/13/2016, Print         [place] Smitty Ackerley, PA-C 10/15/16 0125    Derwood KaplanAnkit Nanavati, MD 10/22/16 [id number]

## 2016-10-23 LAB — OB RESULTS CONSOLE ANTIBODY SCREEN: ANTIBODY SCREEN: NEGATIVE

## 2016-10-23 LAB — OB RESULTS CONSOLE ABO/RH: RH Type: POSITIVE

## 2016-10-23 LAB — OB RESULTS CONSOLE RPR: RPR: NONREACTIVE

## 2016-10-23 LAB — OB RESULTS CONSOLE GC/CHLAMYDIA
CHLAMYDIA, DNA PROBE: NEGATIVE
Gonorrhea: NEGATIVE

## 2016-10-23 LAB — OB RESULTS CONSOLE HIV ANTIBODY (ROUTINE TESTING): HIV: NONREACTIVE

## 2016-10-23 LAB — OB RESULTS CONSOLE HEPATITIS B SURFACE ANTIGEN: HEP B S AG: NEGATIVE

## 2016-10-23 LAB — OB RESULTS CONSOLE RUBELLA ANTIBODY, IGM: RUBELLA: NON-IMMUNE/NOT IMMUNE

## 2016-12-31 NOTE — L&D Delivery Note (Signed)
Delivery Note Pt pushed very well x 3 contractions for delivery.  At 3:02 AM a viable and healthy female was delivered via Vaginal, Spontaneous Delivery (Presentation: OA; LOT ).  APGAR: 9, 9; weight P .   Placenta status: delivered, intact .  Cord: 3V with the following complications: none.  Anesthesia:  nitrous Episiotomy: None Lacerations: 2nd degree Suture Repair: 3.0 vicryl rapide Est. Blood Loss (mL): 250cc  Mom to postpartum.  Baby to Couplet care / Skin to Skin.  Tanith Dagostino Bovard-Stuckert 05/07/2017, 3:28 AM  Br/A+/RNI/Tdap in PNC/contra?

## 2017-03-10 ENCOUNTER — Encounter (HOSPITAL_COMMUNITY): Payer: Self-pay | Admitting: *Deleted

## 2017-03-10 ENCOUNTER — Inpatient Hospital Stay (HOSPITAL_COMMUNITY)
Admission: AD | Admit: 2017-03-10 | Discharge: 2017-03-10 | Disposition: A | Discharge status: Home / Self Care | Payer: Medicaid Other | Source: Ambulatory Visit | Attending: Obstetrics and Gynecology | Admitting: Obstetrics and Gynecology

## 2017-03-10 DIAGNOSIS — Z91018 Allergy to other foods: Secondary | ICD-10-CM | POA: Insufficient documentation

## 2017-03-10 DIAGNOSIS — Z3A29 29 weeks gestation of pregnancy: Secondary | ICD-10-CM

## 2017-03-10 DIAGNOSIS — J069 Acute upper respiratory infection, unspecified: Secondary | ICD-10-CM | POA: Insufficient documentation

## 2017-03-10 DIAGNOSIS — B9789 Other viral agents as the cause of diseases classified elsewhere: Secondary | ICD-10-CM | POA: Diagnosis not present

## 2017-03-10 DIAGNOSIS — Z8249 Family history of ischemic heart disease and other diseases of the circulatory system: Secondary | ICD-10-CM | POA: Insufficient documentation

## 2017-03-10 DIAGNOSIS — O99513 Diseases of the respiratory system complicating pregnancy, third trimester: Secondary | ICD-10-CM | POA: Insufficient documentation

## 2017-03-10 DIAGNOSIS — O9989 Other specified diseases and conditions complicating pregnancy, childbirth and the puerperium: Secondary | ICD-10-CM | POA: Diagnosis not present

## 2017-03-10 DIAGNOSIS — J029 Acute pharyngitis, unspecified: Secondary | ICD-10-CM | POA: Diagnosis present

## 2017-03-10 DIAGNOSIS — Z87891 Personal history of nicotine dependence: Secondary | ICD-10-CM | POA: Insufficient documentation

## 2017-03-10 DIAGNOSIS — G40909 Epilepsy, unspecified, not intractable, without status epilepticus: Secondary | ICD-10-CM | POA: Insufficient documentation

## 2017-03-10 DIAGNOSIS — O99353 Diseases of the nervous system complicating pregnancy, third trimester: Secondary | ICD-10-CM | POA: Diagnosis not present

## 2017-03-10 LAB — URINALYSIS, ROUTINE W REFLEX MICROSCOPIC
BILIRUBIN URINE: NEGATIVE
GLUCOSE, UA: NEGATIVE mg/dL
HGB URINE DIPSTICK: NEGATIVE
Ketones, ur: NEGATIVE mg/dL
Leukocytes, UA: NEGATIVE
Nitrite: NEGATIVE
Protein, ur: NEGATIVE mg/dL
SPECIFIC GRAVITY, URINE: 1.009 (ref 1.005–1.030)
pH: 6 (ref 5.0–8.0)

## 2017-03-10 LAB — INFLUENZA PANEL BY PCR (TYPE A & B)
Influenza A By PCR: NEGATIVE
Influenza B By PCR: NEGATIVE

## 2017-03-10 LAB — RAPID STREP SCREEN (MED CTR MEBANE ONLY): Streptococcus, Group A Screen (Direct): NEGATIVE

## 2017-03-10 NOTE — MAU Note (Signed)
Pt reports sore throat since yesterday. Deniers fever or chills. Pt also has pain in her upper R mouth/gum/tooth? Pt took Zyrtec for her throat yesterday thinking it might be allergies, but no relief.

## 2017-03-10 NOTE — MAU Provider Note (Signed)
History     CSN: [id number]  Arrival date and time: 03/10/17 1504   First Provider Initiated Contact with Patient 03/10/17 1542      Chief Complaint  Patient presents with  . Sore Throat   G1 @29 .6 weeks here with sore throat. Sx started last night. Rates pain 6/10. Associated sx are nasal congestion and non-productive cough. Denies SOB and fever. Some nausea today. Denies sick contacts. Has not used anything for the pain or OTCs. Reports good FM. No pregnancy complaints.     OB History    Gravida Para Term Preterm AB Living   1             SAB TAB Ectopic Multiple Live Births                  Past Medical History:  Diagnosis Date  . Anemia   . Asthma   . Epilepsy (HCC)   . Seasonal allergies     Past Surgical History:  Procedure Laterality Date  . OTHER SURGICAL HISTORY     cyst removal behind left ear    Family History  Problem Relation Age of Onset  . Hypertension Mother   . Cancer Father   . Heart disease Father   . Diabetes Paternal Grandfather   . Cancer Paternal Aunt     Social History  Substance Use Topics  . Smoking status: Former Smoker    Types: Cigars  . Smokeless tobacco: Never Used     Comment: 1-2 cigars a day  . Alcohol use 0.0 oz/week     Comment: patient stopped drinking due to pregnancy    Allergies:  Allergies  Allergen Reactions  . Celery Oil Anaphylaxis  . Rice Anaphylaxis  . Strawberry Extract Anaphylaxis  . Pineapple Itching  . Sudafed [Pseudoephedrine] Other (See Comments)    Dizziness, weakness  . Banana Itching    Prescriptions Prior to Admission  Medication Sig Dispense Refill Last Dose  . cetirizine (ZYRTEC) 10 MG tablet Take 10 mg by mouth as needed.    03/09/2017 at Unknown time  . folic acid (FOLVITE) 800 MCG tablet Take 800 mcg by mouth daily. Pt takes 2400 mg per day   03/10/2017 at Unknown time  . lamoTRIgine (LAMICTAL) 100 MG tablet TAKE 2 TABLETS EACH MORNING AND 2 TABLETS AT BEDTIME. (Patient taking  differently: 100 mg. TAKE 2 TABLETS EACH MORNING AND 2 TABLETS AT BEDTIME.) 360 tablet 3 03/10/2017 at Unknown time  . Prenatal Vit-Fe Fumarate-FA (MULTIVITAMIN-PRENATAL) 27-0.8 MG TABS tablet Take 1 tablet by mouth daily at 12 noon.   03/10/2017 at Unknown time  . amoxicillin (AMOXIL) 500 MG capsule Take 1 capsule (500 mg total) by mouth 3 (three) times daily. 30 capsule 0   . HYDROcodone-acetaminophen (NORCO/VICODIN) 5-325 MG tablet Take 1 tablet by mouth every 8 (eight) hours as needed. 6 tablet 0     Review of Systems  Constitutional: Negative for fever.  HENT: Positive for congestion, postnasal drip and sore throat.   Respiratory: Positive for cough. Negative for shortness of breath.   Gastrointestinal: Negative for abdominal pain.  Genitourinary: Negative for vaginal bleeding.   Physical Exam   Blood pressure 117/75, pulse 112, temperature 98.1 F (36.7 C), temperature source Oral, resp. rate 18, height 5' 4.5" (1.638 m), weight 123.4 kg (272 lb), last menstrual period 08/13/2016, SpO2 100 %.  Physical Exam  Nursing note and vitals reviewed. Constitutional: She is oriented to person, place, and time. She appears well-developed and  well-nourished. No distress.  HENT:  Head: Normocephalic and atraumatic.  Mouth/Throat: Uvula is midline, oropharynx is clear and moist and mucous membranes are normal. No oropharyngeal exudate, posterior oropharyngeal edema, posterior oropharyngeal erythema or tonsillar abscesses.  Neck: Normal range of motion.  Cardiovascular: Normal rate, regular rhythm and normal heart sounds.   Respiratory: Effort normal and breath sounds normal. No respiratory distress.  GI: Soft. She exhibits no distension. There is no tenderness.  Musculoskeletal: Normal range of motion.  Neurological: She is alert and oriented to person, place, and time.  Skin: Skin is warm and dry.  Psychiatric: She has a normal mood and affect.   EFM: 150 bpm, mod variability, + accels, no  decels Toco: none  Results for orders placed or performed during the hospital encounter of 03/10/17 (from the past 24 hour(s))  Urinalysis, Routine w reflex microscopic     Status: Abnormal   Collection Time: 03/10/17  3:15 PM  Result Value Ref Range   Color, Urine YELLOW YELLOW   APPearance HAZY (A) CLEAR   Specific Gravity, Urine 1.009 1.005 - 1.030   pH 6.0 5.0 - 8.0   Glucose, UA NEGATIVE NEGATIVE mg/dL   Hgb urine dipstick NEGATIVE NEGATIVE   Bilirubin Urine NEGATIVE NEGATIVE   Ketones, ur NEGATIVE NEGATIVE mg/dL   Protein, ur NEGATIVE NEGATIVE mg/dL   Nitrite NEGATIVE NEGATIVE   Leukocytes, UA NEGATIVE NEGATIVE  Influenza panel by PCR (type A & B)     Status: None   Collection Time: 03/10/17  3:46 PM  Result Value Ref Range   Influenza A By PCR NEGATIVE NEGATIVE   Influenza B By PCR NEGATIVE NEGATIVE    MAU Course  Procedures  MDM Labs ordered and reviewed. No evidence of flu or pneumonia. Rapid strep pending. Likely UR virus. Will treat with OTCs and supportive measures. Presentation, clinical findings, and plan discussed with Dr. Ellyn Hack. Stable for discharge home.  Assessment and Plan   1. [redacted] weeks gestation of pregnancy   2. Upper respiratory virus    Discharge home Follow up as scheduled in office OTC cold meds-list provided  Allergies as of 03/10/2017      Reactions   Food Anaphylaxis, Other (See Comments)   Pt is allergic to celery and strawberries.     Rice Anaphylaxis   Pineapple Itching   Sudafed [pseudoephedrine] Other (See Comments)   Reaction:  Dizziness and weakness    Banana Itching      Medication List    TAKE these medications   cetirizine 10 MG tablet Commonly known as:  ZYRTEC Take 10 mg by mouth daily as needed for allergies.   folic acid 800 MCG tablet Commonly known as:  FOLVITE Take 2,400 mcg by mouth daily.   lamoTRIgine 100 MG tablet Commonly known as:  LAMICTAL Take 350-400 mg by mouth 2 (two) times daily. Pt takes three  and one-half tablets in the morning and four at bedtime.   multivitamin-prenatal 27-0.8 MG Tabs tablet Take 1 tablet by mouth daily.      Donette Larry, CNM 03/10/2017, 3:52 PM

## 2017-03-10 NOTE — Discharge Instructions (Signed)
Upper Respiratory Infection, Adult Most upper respiratory infections (URIs) are caused by a virus. A URI affects the nose, throat, and upper air passages. The most common type of URI is often called "the common cold." Follow these instructions at home:  Take medicines only as told by your doctor.  Gargle warm saltwater or take cough drops to comfort your throat as told by your doctor.  Use a warm mist humidifier or inhale steam from a shower to increase air moisture. This may make it easier to breathe.  Drink enough fluid to keep your pee (urine) clear or pale yellow.  Eat soups and other clear broths.  Have a healthy diet.  Rest as needed.  Go back to work when your fever is gone or your doctor says it is okay.  You may need to stay home longer to avoid giving your URI to others.  You can also wear a face mask and wash your hands often to prevent spread of the virus.  Use your inhaler more if you have asthma.  Do not use any tobacco products, including cigarettes, chewing tobacco, or electronic cigarettes. If you need help quitting, ask your doctor. Contact a doctor if:  You are getting worse, not better.  Your symptoms are not helped by medicine.  You have chills.  You are getting more short of breath.  You have brown or red mucus.  You have yellow or brown discharge from your nose.  You have pain in your face, especially when you bend forward.  You have a fever.  You have puffy (swollen) neck glands.  You have pain while swallowing.  You have white areas in the back of your throat. Get help right away if:  You have very bad or constant:  Headache.  Ear pain.  Pain in your forehead, behind your eyes, and over your cheekbones (sinus pain).  Chest pain.  You have long-lasting (chronic) lung disease and any of the following:  Wheezing.  Long-lasting cough.  Coughing up blood.  A change in your usual mucus.  You have a stiff neck.  You have  changes in your:  Vision.  Hearing.  Thinking.  Mood. This information is not intended to replace advice given to you by your health care provider. Make sure you discuss any questions you have with your health care provider. Document Released: 06/04/2008 Document Revised: 08/19/2016 Document Reviewed: 03/24/2014 Elsevier Interactive Patient Education  2017 [employer].  

## 2017-03-13 LAB — CULTURE, GROUP A STREP (THRC)

## 2017-04-18 LAB — OB RESULTS CONSOLE GBS: GBS: NEGATIVE

## 2017-05-06 ENCOUNTER — Encounter (HOSPITAL_COMMUNITY): Payer: Self-pay | Admitting: *Deleted

## 2017-05-06 ENCOUNTER — Inpatient Hospital Stay (HOSPITAL_COMMUNITY)
Admission: AD | Admit: 2017-05-06 | Discharge: 2017-05-09 | DRG: 775 | Disposition: A | Discharge status: Home / Self Care | Payer: Medicaid Other | Source: Ambulatory Visit | Attending: Obstetrics and Gynecology | Admitting: Obstetrics and Gynecology

## 2017-05-06 DIAGNOSIS — O99214 Obesity complicating childbirth: Secondary | ICD-10-CM | POA: Diagnosis present

## 2017-05-06 DIAGNOSIS — Z6841 Body Mass Index (BMI) 40.0 and over, adult: Secondary | ICD-10-CM

## 2017-05-06 DIAGNOSIS — O134 Gestational [pregnancy-induced] hypertension without significant proteinuria, complicating childbirth: Principal | ICD-10-CM | POA: Diagnosis present

## 2017-05-06 DIAGNOSIS — O133 Gestational [pregnancy-induced] hypertension without significant proteinuria, third trimester: Secondary | ICD-10-CM | POA: Diagnosis present

## 2017-05-06 DIAGNOSIS — E669 Obesity, unspecified: Secondary | ICD-10-CM | POA: Diagnosis present

## 2017-05-06 DIAGNOSIS — Z3A38 38 weeks gestation of pregnancy: Secondary | ICD-10-CM | POA: Diagnosis not present

## 2017-05-06 DIAGNOSIS — Z87891 Personal history of nicotine dependence: Secondary | ICD-10-CM

## 2017-05-06 LAB — CBC
HCT: 36.6 % (ref 36.0–46.0)
HEMOGLOBIN: 12.6 g/dL (ref 12.0–15.0)
MCH: 29.8 pg (ref 26.0–34.0)
MCHC: 34.4 g/dL (ref 30.0–36.0)
MCV: 86.5 fL (ref 78.0–100.0)
PLATELETS: 168 10*3/uL (ref 150–400)
RBC: 4.23 MIL/uL (ref 3.87–5.11)
RDW: 14.5 % (ref 11.5–15.5)
WBC: 14.1 10*3/uL — AB (ref 4.0–10.5)

## 2017-05-06 LAB — PROTEIN / CREATININE RATIO, URINE
Creatinine, Urine: 84 mg/dL
PROTEIN CREATININE RATIO: 0.18 mg/mg{creat} — AB (ref 0.00–0.15)
TOTAL PROTEIN, URINE: 15 mg/dL

## 2017-05-06 LAB — COMPREHENSIVE METABOLIC PANEL
ALT: 44 U/L (ref 14–54)
ANION GAP: 8 (ref 5–15)
AST: 41 U/L (ref 15–41)
Albumin: 2.8 g/dL — ABNORMAL LOW (ref 3.5–5.0)
Alkaline Phosphatase: 185 U/L — ABNORMAL HIGH (ref 38–126)
BUN: 7 mg/dL (ref 6–20)
CHLORIDE: 105 mmol/L (ref 101–111)
CO2: 22 mmol/L (ref 22–32)
Calcium: 8.8 mg/dL — ABNORMAL LOW (ref 8.9–10.3)
Creatinine, Ser: 0.71 mg/dL (ref 0.44–1.00)
Glucose, Bld: 78 mg/dL (ref 65–99)
POTASSIUM: 3.8 mmol/L (ref 3.5–5.1)
SODIUM: 135 mmol/L (ref 135–145)
Total Bilirubin: 0.9 mg/dL (ref 0.3–1.2)
Total Protein: 6.7 g/dL (ref 6.5–8.1)

## 2017-05-06 LAB — TYPE AND SCREEN
ABO/RH(D): A POS
Antibody Screen: NEGATIVE

## 2017-05-06 MED ORDER — SOD CITRATE-CITRIC ACID 500-334 MG/5ML PO SOLN: 30.0000 mL | ORAL | Status: DC | PRN

## 2017-05-06 MED ORDER — PHENYLEPHRINE 40 MCG/ML (10ML) SYRINGE FOR IV PUSH (FOR BLOOD PRESSURE SUPPORT)
80.0000 ug | PREFILLED_SYRINGE | INTRAVENOUS | Status: DC | PRN
  Filled 2017-05-06: qty 5

## 2017-05-06 MED ORDER — OXYTOCIN 40 UNITS IN LACTATED RINGERS INFUSION - SIMPLE MED
2.5000 [IU]/h | INTRAVENOUS | Status: DC
  Administered 2017-05-07: 2.5 [IU]/h via INTRAVENOUS

## 2017-05-06 MED ORDER — ONDANSETRON HCL 4 MG/2ML IJ SOLN
4.0000 mg | Freq: Four times a day (QID) | INTRAMUSCULAR | Status: DC | PRN
  Administered 2017-05-06: 4 mg via INTRAVENOUS
  Filled 2017-05-06: qty 2

## 2017-05-06 MED ORDER — TERBUTALINE SULFATE 1 MG/ML IJ SOLN
0.2500 mg | Freq: Once | INTRAMUSCULAR | Status: DC | PRN
Start: 2017-05-06 — End: 2017-05-07
  Filled 2017-05-06: qty 1

## 2017-05-06 MED ORDER — OXYTOCIN 40 UNITS IN LACTATED RINGERS INFUSION - SIMPLE MED
1.0000 m[IU]/min | INTRAVENOUS | Status: DC
  Administered 2017-05-06: 2 m[IU]/min via INTRAVENOUS
  Filled 2017-05-06: qty 1000

## 2017-05-06 MED ORDER — LACTATED RINGERS IV SOLN: 500.0000 mL | Freq: Once | INTRAVENOUS | Status: DC

## 2017-05-06 MED ORDER — OXYCODONE-ACETAMINOPHEN 5-325 MG PO TABS: 2.0000 | ORAL_TABLET | ORAL | Status: DC | PRN

## 2017-05-06 MED ORDER — OXYTOCIN BOLUS FROM INFUSION
500.0000 mL | Freq: Once | INTRAVENOUS | Status: AC
  Administered 2017-05-07: 500 mL via INTRAVENOUS

## 2017-05-06 MED ORDER — EPHEDRINE 5 MG/ML INJ
10.0000 mg | INTRAVENOUS | Status: DC | PRN
  Filled 2017-05-06: qty 2

## 2017-05-06 MED ORDER — LIDOCAINE HCL (PF) 1 % IJ SOLN
30.0000 mL | INTRAMUSCULAR | Status: AC | PRN
  Administered 2017-05-07: 30 mL via SUBCUTANEOUS
  Filled 2017-05-06: qty 30

## 2017-05-06 MED ORDER — PHENYLEPHRINE 40 MCG/ML (10ML) SYRINGE FOR IV PUSH (FOR BLOOD PRESSURE SUPPORT)
80.0000 ug | PREFILLED_SYRINGE | INTRAVENOUS | Status: DC | PRN
Start: 2017-05-06 — End: 2017-05-09
  Filled 2017-05-06: qty 5

## 2017-05-06 MED ORDER — HYDRALAZINE HCL 20 MG/ML IJ SOLN
10.0000 mg | Freq: Once | INTRAMUSCULAR | Status: DC | PRN
Start: 2017-05-06 — End: 2017-05-09

## 2017-05-06 MED ORDER — OXYCODONE-ACETAMINOPHEN 5-325 MG PO TABS: 1.0000 | ORAL_TABLET | ORAL | Status: DC | PRN

## 2017-05-06 MED ORDER — EPHEDRINE 5 MG/ML INJ
10.0000 mg | INTRAVENOUS | Status: DC | PRN
Start: 2017-05-06 — End: 2017-05-09
  Filled 2017-05-06: qty 2

## 2017-05-06 MED ORDER — ACETAMINOPHEN 325 MG PO TABS: 650.0000 mg | ORAL_TABLET | ORAL | Status: DC | PRN

## 2017-05-06 MED ORDER — LACTATED RINGERS IV SOLN: 500.0000 mL | INTRAVENOUS | Status: DC | PRN

## 2017-05-06 MED ORDER — LABETALOL HCL 5 MG/ML IV SOLN
20.0000 mg | INTRAVENOUS | Status: DC | PRN
  Filled 2017-05-06: qty 4

## 2017-05-06 MED ORDER — LACTATED RINGERS IV SOLN
INTRAVENOUS | Status: DC
  Administered 2017-05-06 – 2017-05-07 (×2): via INTRAVENOUS

## 2017-05-06 MED ORDER — LAMOTRIGINE 200 MG PO TABS
400.0000 mg | ORAL_TABLET | Freq: Two times a day (BID) | ORAL | Status: DC
  Filled 2017-05-06: qty 2

## 2017-05-06 MED ORDER — DIPHENHYDRAMINE HCL 50 MG/ML IJ SOLN: 12.5000 mg | INTRAMUSCULAR | Status: DC | PRN

## 2017-05-06 MED ORDER — BUTORPHANOL TARTRATE 1 MG/ML IJ SOLN: 1.0000 mg | INTRAMUSCULAR | Status: DC | PRN

## 2017-05-06 MED ORDER — FENTANYL 2.5 MCG/ML BUPIVACAINE 1/10 % EPIDURAL INFUSION (WH - ANES): 14.0000 mL/h | INTRAMUSCULAR | Status: DC | PRN

## 2017-05-06 NOTE — MAU Note (Signed)
Earlier today has seen some blood streaks in d/c. Increased urination. Lower back pains "so bad", constant since last night.

## 2017-05-06 NOTE — Progress Notes (Signed)
Patient ID: Karina Nelson, female   DOB: 07-Dec-1991, 26 y.o.   MRN: [id number]   No problems, starting to feel ctx  AFVSS gen NAD FSE and IUPC placed without diff/comp  FHTs 145, mod var tocp q2-674min  SVE 3/90/0

## 2017-05-06 NOTE — Anesthesia Pain Management Evaluation Note (Signed)
  [facility] Pain Management Visit Note  Patient: Karina Nelson, 26 y.o., female  "Hello I am a member of the anesthesia team at [facility]. We have an anesthesia team available at all times to provide care throughout the hospital, including epidural management and anesthesia for C-section. I don't know your plan for the delivery whether it a natural birth, water birth, IV sedation, nitrous supplementation, doula or epidural, but we want to meet your pain goals."   1.Was your pain managed to your expectations on prior hospitalizations?   No prior hospitalizations  2.What is your expectation for pain management during this hospitalization?     Epidural  3.How can we help you reach that goal?   Record the patient's initial score and the patient's pain goal.   Pain: 0  Pain Goal: 7 [facility] wants you to be able to say your pain was always managed very well.  Laban EmperorMalinova,Wilma Wuthrich Hristova 05/06/2017

## 2017-05-06 NOTE — MAU Note (Signed)
Urine in lab 

## 2017-05-06 NOTE — OB Triage Provider Note (Signed)
Chief Complaint:  Urinary Frequency; Vaginal Bleeding; and Back Pain   First Provider Initiated Contact with Patient 05/06/17 1829      HPI: [place] Karina Nelson is a 26 y.o. G1P0 at 2738w0dwho presents to maternity admissions reporting back pain x3-4 days that is constant and vaginal spotting starting today.  She reports back pain that is constant today but has been intermittent over the last few days.  She has tried drinking more water and changing positions but these have not helped.  She then saw spotting when wiping x 3-4 times today but none in her underwear and she has not required a pad.  She denies h/a, epigastric pain, or visual disturbances.  There are no other associated symptoms. She had one elevated blood pressure at her last office visit. She reports good fetal movement, denies LOF, vaginal itching/burning, h/a, dizziness, n/v, or fever/chills.    HPI  Past Medical History: Past Medical History:  Diagnosis Date  . Anemia   . Asthma   . Epilepsy (HCC)   . Seasonal allergies     Past obstetric history: OB History  Gravida Para Term Preterm AB Living  1            SAB TAB Ectopic Multiple Live Births               # Outcome Date GA Lbr Len/2nd Weight Sex Delivery Anes PTL Lv  1 Current               Past Surgical History: Past Surgical History:  Procedure Laterality Date  . OTHER SURGICAL HISTORY     cyst removal behind left ear    Family History: Family History  Problem Relation Age of Onset  . Hypertension Mother   . Cancer Father   . Heart disease Father   . Diabetes Paternal Grandfather   . Cancer Paternal Aunt     Social History: Social History  Substance Use Topics  . Smoking status: Former Smoker    Types: Cigars  . Smokeless tobacco: Never Used     Comment: 1-2 cigars a day  . Alcohol use No     Comment: patient stopped drinking due to pregnancy    Allergies:  Allergies  Allergen Reactions  . Food Anaphylaxis and Other (See Comments)    Pt is  allergic to celery and strawberries.    . Rice Anaphylaxis  . Pineapple Itching  . Sudafed [Pseudoephedrine] Other (See Comments)    Reaction:  Dizziness and weakness   . Banana Itching    Meds:  Prescriptions Prior to Admission  Medication Sig Dispense Refill Last Dose  . acetaminophen (TYLENOL) 500 MG tablet Take 1,000 mg by mouth every 6 (six) hours as needed for mild pain.   05/06/2017 at 0400  . cetirizine (ZYRTEC) 10 MG tablet Take 10 mg by mouth daily as needed for allergies.    05/05/2017 at Unknown time  . folic acid (FOLVITE) 800 MCG tablet Take 2,400 mcg by mouth daily.    05/05/2017 at Unknown time  . lamoTRIgine (LAMICTAL) 100 MG tablet Take 400 mg by mouth 2 (two) times daily.    05/06/2017 at 1500  . Prenatal Vit-Fe Fumarate-FA (PRENATAL MULTIVITAMIN) TABS tablet Take 1 tablet by mouth daily at 12 noon.   05/05/2017 at Unknown time    ROS:  Review of Systems  Constitutional: Negative for chills, fatigue and fever.  Eyes: Negative for visual disturbance.  Respiratory: Negative for shortness of breath.   Cardiovascular:  Negative for chest pain.  Gastrointestinal: Negative for abdominal pain, nausea and vomiting.  Genitourinary: Negative for difficulty urinating, dysuria, flank pain, pelvic pain, vaginal bleeding, vaginal discharge and vaginal pain.  Neurological: Negative for dizziness and headaches.  Psychiatric/Behavioral: Negative.      I have reviewed patient's Past Medical Hx, Surgical Hx, Family Hx, Social Hx, medications and allergies.   Physical Exam   Patient Vitals for the past 24 hrs:  BP Temp Temp src Pulse Resp SpO2 Height Weight  05/06/17 1954 - - - - - - 5\' 5"  (1.651 m) 291 lb (132 kg)  05/06/17 1930 (!) 141/95 - - 96 - - - -  05/06/17 1915 (!) 138/94 - - 100 - - - -  05/06/17 1845 (!) 138/98 - - (!) 105 - - - -  05/06/17 1830 (!) 135/99 - - (!) 108 - - - -  05/06/17 1815 (!) 144/101 - - (!) 103 - - - -  05/06/17 1803 (!) 148/105 - - (!) 106 - - - -   05/06/17 1802 - - - (!) 105 - - - -  05/06/17 1755 (!) 129/96 - - 95 - - - -  05/06/17 1733 (!) 155/99 98.6 F (37 C) Oral (!) 101 18 99 % - 291 lb 8 oz (132.2 kg)   Constitutional: Well-developed, well-nourished female in no acute distress.  Cardiovascular: normal rate Respiratory: normal effort GI: Abd soft, non-tender, gravid appropriate for gestational age.  MS: Extremities nontender, no edema, normal ROM Neurologic: Alert and oriented x 4.  GU: Neg CVAT.    Dilation: 2 Effacement (%): 90 Exam by:: Sharen Counter, CNM Vertex, confirmed by [place]  FHT:  Baseline 150 , moderate variability, accelerations present, no decelerations Contractions: irregular, mild to palpation   Labs: Results for orders placed or performed during the hospital encounter of 05/06/17 (from the past 24 hour(s))  Protein / creatinine ratio, urine     Status: Abnormal   Collection Time: 05/06/17  5:35 PM  Result Value Ref Range   Creatinine, Urine 84.00 mg/dL   Total Protein, Urine 15 mg/dL   Protein Creatinine Ratio 0.18 (H) 0.00 - 0.15 mg/mg[Cre]  CBC     Status: Abnormal   Collection Time: 05/06/17  7:15 PM  Result Value Ref Range   WBC 14.1 (H) 4.0 - 10.5 K/uL   RBC 4.23 3.87 - 5.11 MIL/uL   Hemoglobin 12.6 12.0 - 15.0 g/dL   HCT 13.0 86.5 - 78.4 %   MCV 86.5 78.0 - 100.0 fL   MCH 29.8 26.0 - 34.0 pg   MCHC 34.4 30.0 - 36.0 g/dL   RDW 69.6 29.5 - 28.4 %   Platelets 168 150 - 400 K/uL   A/Positive/-- (10/24 0000)  Imaging:  No results found.  MAU Course/MDM: I have ordered labs and reviewed results.  NST reviewed and reactive CBC, CMP, P/C ratio, UA ordered Consult Dr Hinton Rao with presentation, exam findings and test results.  Admit to BS for IOL for GHTN No evidence of preeclampsia today   Assessment: 1. Pregnancy-induced hypertension in third trimester     Plan: Admit to BS for IOL Preeclampsia labs pending  Sharen Counter Certified  Nurse-Midwife 05/06/2017 7:55 PM

## 2017-05-06 NOTE — H&P (Signed)
Karina Nelson is a 26 y.o. female G1P0 at 80+ with PIH.  Also with epilepsy on lamotrigine.  Size > dates [place] - growth at 27%.  Also maternal obesity.  Failed glucola, nl 3 hr GTT.  Screened positive for SMA - FOB neg.    OB History    Gravida Para Term Preterm AB Living   1             SAB TAB Ectopic Multiple Live Births                G1 present h/o abn pap, last WNL + Chl  Past Medical History:  Diagnosis Date  . Anemia   . Asthma   . Epilepsy (HCC)   . Seasonal allergies    Past Surgical History:  Procedure Laterality Date  . OTHER SURGICAL HISTORY     cyst removal behind left ear   Family History: family history includes Cancer in her father and paternal aunt; Diabetes in her paternal grandfather; Heart disease in her father; Hypertension in her mother. Social History:  reports that she has quit smoking. Her smoking use included Cigars. She has never used smokeless tobacco. She reports that she does not drink alcohol or use drugs.single - in relationship Meds lamotrigine, PNV All Sudafed, pineapple, strawberry, banana, rice     Maternal Diabetes: No Genetic Screening: Normal Maternal Ultrasounds/Referrals: Normal Fetal Ultrasounds or other Referrals:  None Maternal Substance Abuse:  No Significant Maternal Medications:  None Significant Maternal Lab Results:  Lab values include: Group B Strep negative Other Comments:  SMA +, FOB neg  Review of Systems  Constitutional: Negative.   HENT: Negative.   Eyes: Negative.   Respiratory: Negative.   Cardiovascular: Negative.   Gastrointestinal: Negative.   Genitourinary: Negative.   Musculoskeletal: Positive for back pain.  Skin: Negative.   Neurological: Negative.   Psychiatric/Behavioral: Negative.    Maternal Medical History:  Contractions: Frequency: irregular.    Fetal activity: Perceived fetal activity is normal.    Prenatal complications: PIH.   Prenatal Complications - Diabetes: none.    Dilation:  2 Effacement (%): 90 Exam by:: Sharen Counter, CNM Blood pressure (!) 138/98, pulse (!) 105, temperature 98.6 F (37 C), temperature source Oral, resp. rate 18, weight 132.2 kg (291 lb 8 oz), last menstrual period 08/13/2016, SpO2 99 %. Maternal Exam:  Abdomen: Patient reports no abdominal tenderness. Fundal height is appropriate for gestation.   Estimated fetal weight is 6-6.5#.   Fetal presentation: vertex  Introitus: Normal vulva. Normal vagina.    Physical Exam  Constitutional: She is oriented to person, place, and time. She appears well-developed and well-nourished.  HENT:  Head: Normocephalic and atraumatic.  Cardiovascular: Normal rate.   Respiratory: Effort normal and breath sounds normal. No respiratory distress. She has no wheezes.  GI: Soft. Bowel sounds are normal. She exhibits no distension. There is no tenderness.  Musculoskeletal: Normal range of motion.  Neurological: She is alert and oriented to person, place, and time.  Skin: Skin is warm and dry.  Psychiatric: She has a normal mood and affect. Her behavior is normal.    Prenatal labs: ABO, Rh: --/--/A POS (09/24 1623) Antibody:  neg Rubella:  NI RPR:   NR HBsAg:   neg HIV: Non Reactive (09/24 1623)  GBS:   neg  Hgb 12.2/ Plt 221/Ur Cx neg/ GC neg/ Chl neg/ Varicella immune/Hgb electro WNL/ Pap WNL/ TSH WNL/First tri screen neg, nl NT/ glucola 144/ 3 hr WNL/+ SMA,  neg CF, neg fragile X; FOB AMA neg  Nl anat, post plac, female Last US 04/30/17, EFW 6#1  SVE 1/70/-2  Assessment/Plan: 26yo G1P0 at 37 with PIH, has obesity, sz d/o and S>D with good growth 1. Augment with AROM and pitocin 2. Monitor BP closely - labetalol protocol 3. Epidural, nitrous or IV pain meds prn 4. Expect SVD    Helga Asbury Bovard-Stuckert 05/06/2017, 7:03 PM

## 2017-05-06 NOTE — Progress Notes (Signed)
Patient ID: Karina Nelson, female   DOB: 05/02/1991, 26 y.o.   MRN: [id number]   No c/o's, some ctx  AFVSS (BP 129/87-145/105) gen NAD FHTs 145, mod var, + accels, category 1 toco irr  SVE 3/90/-1  AROM for clear fluid, w/o diff/comp  Continue IOL

## 2017-05-06 NOTE — MAU Note (Signed)
BP elevated, reports feet are swollen, denies HA, visual changes or epigastric

## 2017-05-07 ENCOUNTER — Encounter (HOSPITAL_COMMUNITY): Payer: Self-pay | Admitting: Obstetrics and Gynecology

## 2017-05-07 LAB — CBC
HEMATOCRIT: 31.6 % — AB (ref 36.0–46.0)
HEMOGLOBIN: 11 g/dL — AB (ref 12.0–15.0)
MCH: 30.1 pg (ref 26.0–34.0)
MCHC: 34.8 g/dL (ref 30.0–36.0)
MCV: 86.3 fL (ref 78.0–100.0)
Platelets: 163 10*3/uL (ref 150–400)
RBC: 3.66 MIL/uL — AB (ref 3.87–5.11)
RDW: 14.7 % (ref 11.5–15.5)
WBC: 19.9 10*3/uL — AB (ref 4.0–10.5)

## 2017-05-07 LAB — RPR: RPR: NONREACTIVE

## 2017-05-07 MED ORDER — SENNOSIDES-DOCUSATE SODIUM 8.6-50 MG PO TABS
2.0000 | ORAL_TABLET | ORAL | Status: DC
  Administered 2017-05-08: 2 via ORAL
  Filled 2017-05-07 (×2): qty 2

## 2017-05-07 MED ORDER — ZOLPIDEM TARTRATE 5 MG PO TABS: 5.0000 mg | ORAL_TABLET | Freq: Every evening | ORAL | Status: DC | PRN

## 2017-05-07 MED ORDER — LORATADINE 10 MG PO TABS
10.0000 mg | ORAL_TABLET | Freq: Every day | ORAL | Status: DC
  Filled 2017-05-07: qty 1

## 2017-05-07 MED ORDER — OXYCODONE HCL 5 MG PO TABS: 5.0000 mg | ORAL_TABLET | ORAL | Status: DC | PRN

## 2017-05-07 MED ORDER — WITCH HAZEL-GLYCERIN EX PADS: 1.0000 "application " | MEDICATED_PAD | CUTANEOUS | Status: DC | PRN

## 2017-05-07 MED ORDER — IBUPROFEN 600 MG PO TABS
600.0000 mg | ORAL_TABLET | Freq: Four times a day (QID) | ORAL | Status: DC
  Administered 2017-05-07 – 2017-05-09 (×10): 600 mg via ORAL
  Filled 2017-05-07 (×10): qty 1

## 2017-05-07 MED ORDER — ONDANSETRON HCL 4 MG/2ML IJ SOLN: 4.0000 mg | INTRAMUSCULAR | Status: DC | PRN

## 2017-05-07 MED ORDER — ACETAMINOPHEN 325 MG PO TABS
650.0000 mg | ORAL_TABLET | ORAL | Status: DC | PRN
  Filled 2017-05-07: qty 2

## 2017-05-07 MED ORDER — LAMOTRIGINE 200 MG PO TABS
400.0000 mg | ORAL_TABLET | Freq: Two times a day (BID) | ORAL | Status: DC
  Administered 2017-05-07 – 2017-05-09 (×5): 400 mg via ORAL
  Filled 2017-05-07 (×8): qty 2

## 2017-05-07 MED ORDER — COCONUT OIL OIL: 1.0000 "application " | TOPICAL_OIL | Status: DC | PRN

## 2017-05-07 MED ORDER — BENZOCAINE-MENTHOL 20-0.5 % EX AERO
1.0000 "application " | INHALATION_SPRAY | CUTANEOUS | Status: DC | PRN
  Administered 2017-05-07: 1 via TOPICAL
  Filled 2017-05-07: qty 56

## 2017-05-07 MED ORDER — ONDANSETRON HCL 4 MG PO TABS: 4.0000 mg | ORAL_TABLET | ORAL | Status: DC | PRN

## 2017-05-07 MED ORDER — OXYCODONE HCL 5 MG PO TABS: 10.0000 mg | ORAL_TABLET | ORAL | Status: DC | PRN

## 2017-05-07 MED ORDER — PRENATAL MULTIVITAMIN CH
1.0000 | ORAL_TABLET | Freq: Every day | ORAL | Status: DC
  Administered 2017-05-07 – 2017-05-09 (×3): 1 via ORAL
  Filled 2017-05-07 (×3): qty 1

## 2017-05-07 MED ORDER — MEASLES, MUMPS & RUBELLA VAC ~~LOC~~ INJ
0.5000 mL | INJECTION | Freq: Once | SUBCUTANEOUS | Status: AC
  Administered 2017-05-09: 0.5 mL via SUBCUTANEOUS
  Filled 2017-05-07 (×2): qty 0.5

## 2017-05-07 MED ORDER — SIMETHICONE 80 MG PO CHEW: 80.0000 mg | CHEWABLE_TABLET | ORAL | Status: DC | PRN

## 2017-05-07 MED ORDER — LACTATED RINGERS IV SOLN
INTRAVENOUS | Status: DC
Start: 2017-05-07 — End: 2017-05-09

## 2017-05-07 MED ORDER — DIBUCAINE 1 % RE OINT: 1.0000 "application " | TOPICAL_OINTMENT | RECTAL | Status: DC | PRN

## 2017-05-07 MED ORDER — DIPHENHYDRAMINE HCL 25 MG PO CAPS: 25.0000 mg | ORAL_CAPSULE | Freq: Four times a day (QID) | ORAL | Status: DC | PRN

## 2017-05-07 NOTE — Progress Notes (Signed)
UR chart review completed.  

## 2017-05-07 NOTE — Lactation Note (Signed)
This note was copied from a baby's chart. Lactation Consultation Note  Patient Name: Girl [place] Smylie Today's Date: 05/07/2017 Reason for consult: Initial assessment   Initial assessment with first time mom of 9 hour old infant. Infant with 2 BF, 1 attempt, and 2 stools since birth. LATCH scores 6. Infant weight 6 lb 14 oz. Maternal history of Epilepsy on Lamictal- Lamictal is an L2 (Significant Data-Compatible) according to Bobbye Mortonhomas Hale, MD and Medications and Mother's Milk.   Infant recently had a bath and was sleepy. Assisted mom in latching infant to left breast in the football hold. Infant latched on and off and was sleepy at breast. Reviewed hand expression with mom and obtained 2 cc colostrum that was spoon fed to infant. Infant tolerated feeding well. Enc mom to feed infant STS 8-12 x in 24 hours at first feeding cues. Enc mom to offer infant colostrum via spoon after BF or if too sleepy to BF. Enc mom to use head and pillow support with feeding and to massage/compress breast during feeding.   Mom with large compressible breasts with everted nipples that flatten some with areolar compression. Colostrum very easily expressible.   Mom had several visitors in the room telling mom the infant is hungry and needs formula. Reviewed I/O, NB nutritional needs, colostrum, milk coming to volume, and milk coming to volume. Reviewed LEAD with mom and enc mom to continue to BF infant with feeding cues to promote milk production and prevent engorgement. Reviewed feeding cues with mom. Mom reports her cousin is getting her a pump.   Bf Resources Handout and LC Brochure given, mom informed of IP/OP Services, BF Support Groups and LC phone #. Enc mom to call out for feeding assistance as needed. [facility] and is planning to call and make appt after d/c. Mom has a manual pump that she is using to help pull nipple out prior to latch. Mom without further questions/concerns at this time.   Maternal  Data Formula Feeding for Exclusion: Yes Reason for exclusion: Mother's choice to formula and breast feed on admission Has patient been taught Hand Expression?: Yes Does the patient have breastfeeding experience prior to this delivery?: No  Feeding Feeding Type: Breast Fed Length of feed: 10 min  LATCH Score/Interventions Latch: Repeated attempts needed to sustain latch, nipple held in mouth throughout feeding, stimulation needed to elicit sucking reflex. Intervention(s): Skin to skin;Teach feeding cues;Waking techniques Intervention(s): Adjust position;Assist with latch;Breast massage;Breast compression  Audible Swallowing: A few with stimulation Intervention(s): Skin to skin;Hand expression;Alternate breast massage  Type of Nipple: Everted at rest and after stimulation Intervention(s): Hand pump  Comfort (Breast/Nipple): Soft / non-tender     Hold (Positioning): Assistance needed to correctly position infant at breast and maintain latch. Intervention(s): Breastfeeding basics reviewed;Support Pillows;Position options;Skin to skin  LATCH Score: 7  Lactation Tools Discussed/Used WIC Program: Yes   Consult Status Consult Status: Follow-up Date: 05/08/17 Follow-up type: In-patient    Silas FloodSharon S Jerrie Gullo 05/07/2017, 12:31 PM

## 2017-05-07 NOTE — Progress Notes (Signed)
Patient ID: [place] E Ebers, female   DOB: [birth year], 26 y.o.   MRN: [id number]   Uncomfortable with ctx  AF 138-158/89-118 (157/103)  gen uncomfortable 160's mod var, category 1 toco diff to trace  9.5/100/0-+1  Expect SVD soon

## 2017-05-07 NOTE — Progress Notes (Signed)
Patient ID: [place] E [place]iff, female   DOB: 03-16-1991, 26 y.o.   MRN: [id number] DOD Pt feeling well, eating  BP last check 130/60-70 Was elevated in labor, but seems to be coming down over last several hours Pulse 108-115  Lochia normal  Will follow BP today and treat if remain elevated

## 2017-05-08 NOTE — Progress Notes (Signed)
Patient ID: Karina Nelson, female   DOB: 1991-09-13, 26 y.o.   MRN: [id number] PPD #1 No problems Afeb, VSS, BP 120-140/60-90 Fundus firm, NT at U-1 Continue routine postpartum care, monitor BP

## 2017-05-09 MED ORDER — IBUPROFEN 600 MG PO TABS: 600.0000 mg | ORAL_TABLET | Freq: Four times a day (QID) | ORAL | 1 refills | Status: DC | PRN

## 2017-05-09 NOTE — Discharge Instructions (Signed)
Nothing in vagina for 6 weeks.  No sex, tampons, and douching.  Other instructions as in [facility] Discharge Booklet. °

## 2017-05-09 NOTE — Lactation Note (Signed)
This note was copied from a baby's chart. Lactation Consultation Note: Mother unsure if infant is getting enough. Mother reports that infant is not latching well. Mothers nipples are semi-flat. Assist mother with positioning infant in football hold. Infant latched on using tea-cup hold. Infant took a few sucks for several mins. Mother was fit with a #20 nipple shield. Infant latched on nipple shield for a 5-10 mins. Infant was observed with a few swallows and observed colostrum in the shield. Assist mother with hand expression and infant was given 5 ml ebm with gloved finger and syringe. Mother assist with pumping with hand pump. Mother to pump for 15 mins. On each breast and give ebm back to infant with a syringe. Mother reports that she feels better that infant has had a feeding. Mother advised to breastfeed with or without the shield with all feeding cues, and at least 8-12 times in 24 hours. Mother to do frequent skin to skin. Mother has a Programme researcher, broadcasting/film/videoLansinoh electric pump for home use. Mother advised to continue to cue base feed,. Mother informed of treatment to prevent severe engorgement. Mother advised to massage breast well and use ice for 15 mins every 3-4 hours. Mother receptive to all teaching. Mother is aware of all LC services and community support.  Patient Name: Karina Nelson Today's Date: 05/09/2017 Reason for consult: Follow-up assessment   Maternal Data    Feeding Feeding Type: Breast Milk Nipple Type:  (ona nd off for 10 mins) Length of feed: 2 min  LATCH Score/Interventions Latch: Grasps breast easily, tongue down, lips flanged, rhythmical sucking. Intervention(s): Waking techniques Intervention(s): Adjust position;Breast compression  Audible Swallowing: A few with stimulation Intervention(s): Skin to skin Intervention(s): Hand expression  Type of Nipple: Flat Intervention(s): Hand pump  Comfort (Breast/Nipple): Soft / non-tender     Hold (Positioning): Assistance needed to  correctly position infant at breast and maintain latch. Intervention(s): Support Pillows;Position options  LATCH Score: 7  Lactation Tools Discussed/Used Tools: Nipple Shields Nipple shield size: 20   Consult Status Consult Status: Follow-up Date: 05/09/17 Follow-up type: In-patient    Karina Nelson, Karina Nelson [facility] 05/09/2017, 12:12 PM

## 2017-05-09 NOTE — Progress Notes (Signed)
Patient ID: Karina Nelson, female   DOB: Aug 30, 1991, 26 y.o.   MRN: [id number] Pt doing well. Pain well controlled. No CP, SOB or HAs. Bonding well with baby - has worked with Advertising copywriterlactation consultant on breastfeeding. Ready for discharge to home today VSS ABD - Soft, FF EXT - no Homans or edema  A/P: PPD#2 s/p svd - stable         Discharge instructions reviewed         [facility]

## 2017-05-09 NOTE — Discharge Summary (Signed)
OB Discharge Summary     Patient Name: Karina Nelson DOB: 08/23/1991 MRN: [id number]  Date of admission: 05/06/2017 Delivering MD: Sherian ReinBOVARD-STUCKERT, JODY   Date of discharge: 05/09/2017  Admitting diagnosis: 38wks light bleeding Intrauterine pregnancy: 8244w1d     Secondary diagnosis:  Principal Problem:   SVD (spontaneous vaginal delivery) Active Problems:   PIH (pregnancy induced hypertension), third trimester  Additional problems: none     Discharge diagnosis: Term Pregnancy Delivered                                                                                                Post partum procedures:none  Augmentation: AROM and Pitocin  Complications: None  Hospital course:  Induction of Labor With Vaginal Delivery   26 y.o. yo G1P1001 at 1244w1d was admitted to the hospital 05/06/2017 for induction of labor.  Indication for induction: Gestational hypertension.  Patient had an uncomplicated labor course as follows: Membrane Rupture Time/Date: 9:15 PM ,05/06/2017   Intrapartum Procedures: Episiotomy: None [1]                                         Lacerations:  2nd degree [3]  Patient had delivery of a Viable infant.  Information for the patient's newborn:  Broome LionsRaley, Karina Karina [[id number]][[id number]]      05/07/2017  Details of delivery can be found in separate delivery note.  Patient had a routine postpartum course. Patient is discharged home 05/09/17.  Physical exam  Vitals:   05/07/17 1840 05/08/17 0540 05/08/17 1900 05/09/17 0610  BP: 135/82 (!) 143/89 130/83 130/88  Pulse: (!) 102 99 (!) 110 (!) 105  Resp: 18 17 18 20   Temp: 98 F (36.7 C) 97.7 F (36.5 C) 97.6 F (36.4 C) 98.2 F (36.8 C)  TempSrc: Oral Oral Oral Oral  SpO2:      Weight:      Height:       General: alert, cooperative and no distress Lochia: appropriate Uterine Fundus: firm Incision: N/A DVT Evaluation: No evidence of DVT seen on physical exam. Labs: Lab Results  Component Value Date   WBC 19.9 (H)  05/07/2017   HGB 11.0 (L) 05/07/2017   HCT 31.6 (L) 05/07/2017   MCV 86.3 05/07/2017   PLT 163 05/07/2017   CMP Latest Ref Rng & Units 05/06/2017  Glucose 65 - 99 mg/dL 78  BUN 6 - 20 mg/dL 7  Creatinine 7.820.44 - 9.561.00 mg/dL 2.130.71  Sodium 086135 - 578145 mmol/L 135  Potassium 3.5 - 5.1 mmol/L 3.8  Chloride 101 - 111 mmol/L 105  CO2 22 - 32 mmol/L 22  Calcium 8.9 - 10.3 mg/dL 4.6(N8.8(L)  Total Protein 6.5 - 8.1 g/dL 6.7  Total Bilirubin 0.3 - 1.2 mg/dL 0.9  Alkaline Phos 38 - 126 U/L 185(H)  AST 15 - 41 U/L 41  ALT 14 - 54 U/L 44    Discharge instruction: per After Visit Summary and "Baby and Me Booklet".  After visit meds:  Allergies as of  05/09/2017      Reactions   Food Anaphylaxis, Other (See Comments)   Pt is allergic to celery and strawberries.     Rice Anaphylaxis   Pineapple Itching   Sudafed [pseudoephedrine] Other (See Comments)   Reaction:  Dizziness and weakness    Banana Itching      Medication List    STOP taking these medications   acetaminophen 500 MG tablet Commonly known as:  TYLENOL     TAKE these medications   cetirizine 10 MG tablet Commonly known as:  ZYRTEC Take 10 mg by mouth daily as needed for allergies.   folic acid 800 MCG tablet Commonly known as:  FOLVITE Take 2,400 mcg by mouth daily.   ibuprofen 600 MG tablet Commonly known as:  ADVIL,MOTRIN Take 1 tablet (600 mg total) by mouth every 6 (six) hours as needed.   lamoTRIgine 100 MG tablet Commonly known as:  LAMICTAL Take 400 mg by mouth 2 (two) times daily.   prenatal multivitamin Tabs tablet Take 1 tablet by mouth daily at 12 noon.       Diet: low salt diet  Activity: Advance as tolerated. Pelvic rest for 6 weeks.   Outpatient follow up:2 weeks Follow up Appt:No future appointments. Follow up Visit:No Follow-up on file.  Postpartum contraception: Undecided  Newborn Data: Live born female  Birth Weight: 6 lb 14.6 oz (3135 g) APGAR: 9, 9  Baby Feeding:  Breast Disposition:home with mother   05/09/2017 Cathrine Muster, DO

## 2017-11-24 ENCOUNTER — Encounter (HOSPITAL_COMMUNITY): Payer: Self-pay

## 2017-11-24 ENCOUNTER — Emergency Department (HOSPITAL_COMMUNITY)
Admission: EM | Admit: 2017-11-24 | Discharge: 2017-11-24 | Disposition: A | Discharge status: Home / Self Care | Payer: Self-pay | Attending: Emergency Medicine | Admitting: Emergency Medicine

## 2017-11-24 DIAGNOSIS — Z87891 Personal history of nicotine dependence: Secondary | ICD-10-CM | POA: Insufficient documentation

## 2017-11-24 DIAGNOSIS — Z79899 Other long term (current) drug therapy: Secondary | ICD-10-CM | POA: Insufficient documentation

## 2017-11-24 DIAGNOSIS — K029 Dental caries, unspecified: Secondary | ICD-10-CM | POA: Insufficient documentation

## 2017-11-24 DIAGNOSIS — J45909 Unspecified asthma, uncomplicated: Secondary | ICD-10-CM | POA: Insufficient documentation

## 2017-11-24 MED ORDER — PENICILLIN V POTASSIUM 500 MG PO TABS
500.0000 mg | ORAL_TABLET | Freq: Once | ORAL | Status: AC
  Administered 2017-11-24: 500 mg via ORAL
  Filled 2017-11-24: qty 1

## 2017-11-24 MED ORDER — NAPROXEN 500 MG PO TABS
500.0000 mg | ORAL_TABLET | Freq: Once | ORAL | Status: AC
  Administered 2017-11-24: 500 mg via ORAL
  Filled 2017-11-24: qty 1

## 2017-11-24 MED ORDER — PENICILLIN V POTASSIUM 500 MG PO TABS: 500.0000 mg | ORAL_TABLET | Freq: Four times a day (QID) | ORAL | 0 refills | Status: DC

## 2017-11-24 MED ORDER — NAPROXEN 375 MG PO TABS: 375.0000 mg | ORAL_TABLET | Freq: Two times a day (BID) | ORAL | 0 refills | Status: DC

## 2017-11-24 NOTE — ED Triage Notes (Signed)
Pt complains of right sided dental pain that started on the bottom ans is now on the top also, her right side of her face is swollen Pt has a dentist but waiting on her insurance to renew

## 2017-11-24 NOTE — ED Provider Notes (Signed)
[facility]-EMERGENCY DEPT Provider Note   CSN: [id number] Arrival date & time: 11/24/17  2253     History   Chief Complaint Chief Complaint  Patient presents with  . Dental Pain    HPI Karina Nelson is a 26 y.o. female.  The history is provided by the patient.  Dental Pain   This is a recurrent problem. The current episode started more than 1 week ago. The problem occurs constantly. The problem has not changed since onset.The pain is severe. She has tried nothing for the symptoms. The treatment provided no relief.    Past Medical History:  Diagnosis Date  . Anemia   . Asthma   . Epilepsy (HCC)   . Seasonal allergies   . SVD (spontaneous vaginal delivery) 05/07/2017    Patient Active Problem List   Diagnosis Date Noted  . SVD (spontaneous vaginal delivery) 05/07/2017  . PIH (pregnancy induced hypertension), third trimester 05/06/2017  . Localization-related (focal) (partial) idiopathic epilepsy and epileptic syndromes with seizures of localized onset, not intractable, without status epilepticus (HCC) 11/17/2015  . Frequent nosebleeds 09/21/2014  . Insomnia 03/09/2014  . 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 Encounter for long-term (current) use of other medications 07/08/2013  . Morbid obesity (HCC) 07/08/2013  . Attention deficit disorder with hyperactivity(314.01) 07/08/2013  . Migraine without aura 07/08/2013    Past Surgical History:  Procedure Laterality Date  . OTHER SURGICAL HISTORY     cyst removal behind left ear    OB History    Gravida Para Term Preterm AB Living   1 1 1     1    SAB TAB Ectopic Multiple Live Births         0 1       Home Medications    Prior to Admission medications   Medication Sig Start Date End Date Taking? Authorizing Provider  cetirizine (ZYRTEC) 10 MG tablet Take 10 mg by mouth daily as needed for allergies.     [provider]  folic acid (FOLVITE) 800 MCG tablet Take 2,400  mcg by mouth daily.     [provider]  ibuprofen (ADVIL,MOTRIN) 600 MG tablet Take 1 tablet (600 mg total) by mouth every 6 (six) hours as needed. 05/09/17   Edwinna AreolaBanga, Cecilia Worema, DO  lamoTRIgine (LAMICTAL) 100 MG tablet Take 400 mg by mouth 2 (two) times daily.     [provider]  Prenatal Vit-Fe Fumarate-FA (PRENATAL MULTIVITAMIN) TABS tablet Take 1 tablet by mouth daily at 12 noon.    [provider]    Family History Family History  Problem Relation Age of Onset  . Hypertension Mother   . Cancer Father   . Heart disease Father   . Diabetes Paternal Grandfather   . Cancer Paternal Aunt     Social History Social History   Tobacco Use  . Smoking status: Former Smoker    Types: Cigars  . Smokeless tobacco: Never Used  . Tobacco comment: 1-2 cigars a day  Substance Use Topics  . Alcohol use: No    Alcohol/week: 0.0 oz    Comment: patient stopped drinking due to pregnancy  . Drug use: No     Allergies   Food; Rice; Pineapple; Sudafed [pseudoephedrine]; and Banana   Review of Systems Review of Systems  Constitutional: Negative for fever.  HENT: Positive for dental problem. Negative for drooling and facial swelling.   Respiratory: Negative for shortness of breath.   Cardiovascular: Negative for  chest pain.  All other systems reviewed and are negative.    Physical Exam Updated Vital Signs BP (!) 132/96 (BP Location: Right Arm)   Pulse (!) 111   Temp 98.8 F (37.1 C) (Oral)   Resp 16   LMP 11/13/2017   SpO2 98%   Physical Exam  Constitutional: She is oriented to person, place, and time. She appears well-developed and well-nourished. No distress.  HENT:  Head: Normocephalic and atraumatic.  Mouth/Throat: No oropharyngeal exudate.    Eyes: Conjunctivae are normal. Pupils are equal, round, and reactive to light.  Neck: Normal range of motion. Neck supple.  Cardiovascular: Normal rate, regular rhythm, normal heart sounds and intact  distal pulses.  Pulmonary/Chest: Effort normal and breath sounds normal. No stridor. She has no wheezes. She has no rales.  Abdominal: Soft. Bowel sounds are normal. She exhibits no mass. There is no tenderness. There is no rebound and no guarding.  Musculoskeletal: Normal range of motion.  Neurological: She is alert and oriented to person, place, and time. She displays normal reflexes.  Skin: Skin is warm and dry. Capillary refill takes less than 2 seconds.  Psychiatric: She has a normal mood and affect.     ED Treatments / Results   Vitals:   11/24/17 2258  BP: (!) 132/96  Pulse: (!) 111  Resp: 16  Temp: 98.8 F (37.1 C)  SpO2: 98%    Procedures Procedures (including critical care time)  Medications Ordered in ED Medications  penicillin v potassium (VEETID) tablet 500 mg (not administered)  naproxen (NAPROSYN) tablet 500 mg (not administered)       Final Clinical Impressions(s) / ED Diagnoses  All questions answered to the patient's satisfaction.   Strict return precautions for fever, inability to open the mouth, inability to speech, swelling behind the ear or in the neck, fevers,  global weakness, vomiting, swelling or the lips or tongue, chest pain, dyspnea on exertion,shortness of breath, persistent pain, Inability to tolerate liquids or food, cough, altered mental status or any concerns. No signs of systemic illness or infection. The patient is nontoxic-appearing on exam and vital signs are within normal limits.    I have reviewed the triage vital signs and the nursing notes. Pertinent labs &imaging results that were available during my care of the patient were reviewed by me and considered in my medical decision making (see chart for details).  After history, exam, and medical workup I feel the patient has been appropriately medically screened and is safe for discharge home. Pertinent diagnoses were discussed with the patient. Patient was given return  precautions    Terilyn Sano, MD 11/24/17 2319

## 2018-02-25 DIAGNOSIS — M5134 Other intervertebral disc degeneration, thoracic region: Secondary | ICD-10-CM | POA: Diagnosis not present

## 2018-02-25 DIAGNOSIS — M545 Low back pain: Secondary | ICD-10-CM | POA: Diagnosis not present

## 2018-02-25 DIAGNOSIS — R079 Chest pain, unspecified: Secondary | ICD-10-CM | POA: Diagnosis not present

## 2018-04-22 DIAGNOSIS — M545 Low back pain: Secondary | ICD-10-CM | POA: Diagnosis not present

## 2018-04-24 IMAGING — US US OB TRANSVAGINAL
1 series · 15 of 28 positions shown · non-contrast
Comparison: 09/23/2016 obstetric scan.

CLINICAL DATA: 25-year-old pregnant female presents for follow-up
of gestational viability. Rising beta HCG, 22,309 09/26/2016 and
51395 on 09/23/2016.

EDC by LMP: 05/20/2017, projecting to an expected gestational age of
6 weeks 2 days.
EXAM:
TRANSVAGINAL OB ULTRASOUND
TECHNIQUE: Transvaginal ultrasound was performed for complete evaluation of the
gestation as well as the maternal uterus, adnexal regions, and
pelvic cul-de-sac.

[Series 1: us ob transvaginal · 15 of 29 slices shown]
[im 1/29]
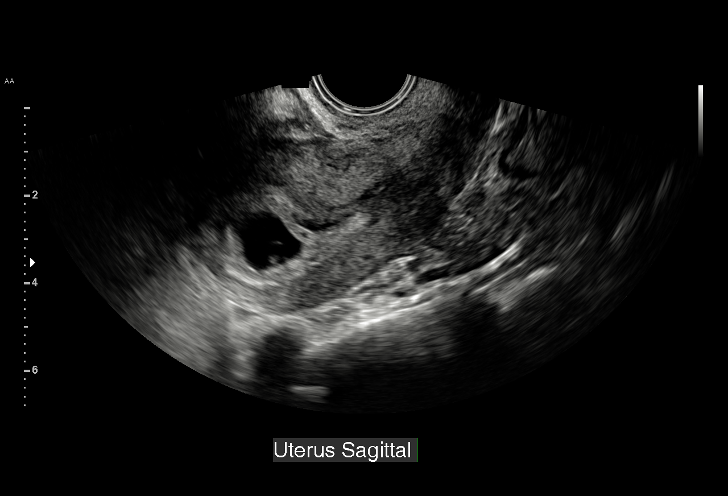
[im 3/29]
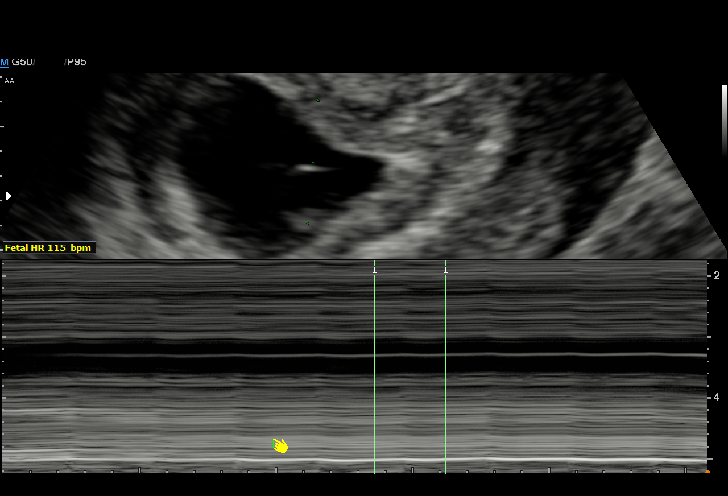
[im 5/29]
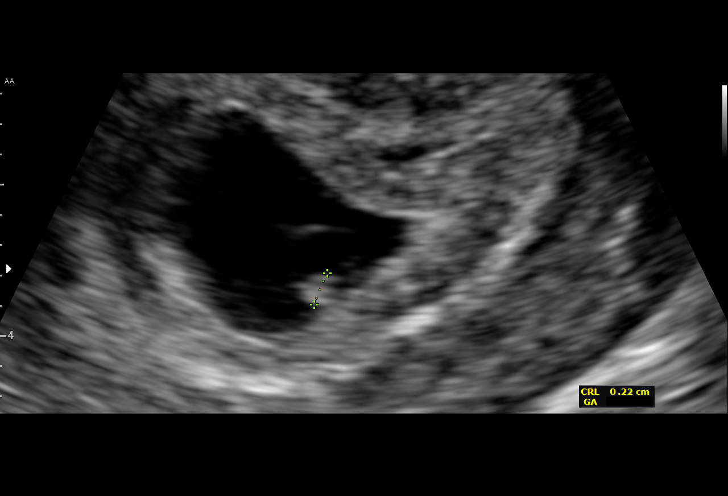
[im 7/29]
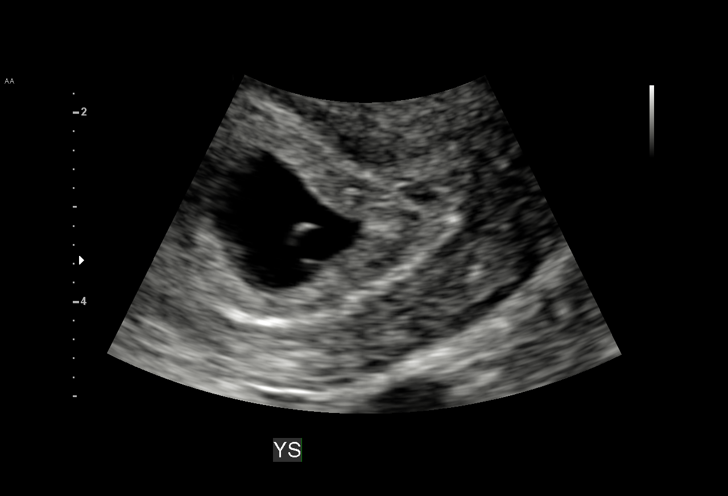
[im 9/29]
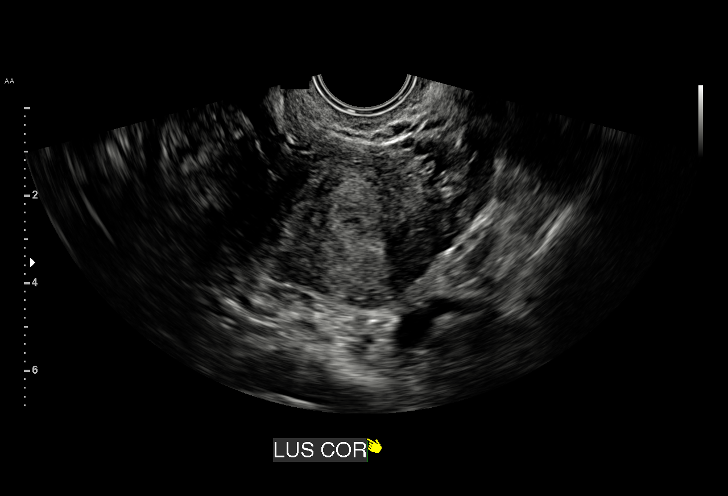
[im 11/29]
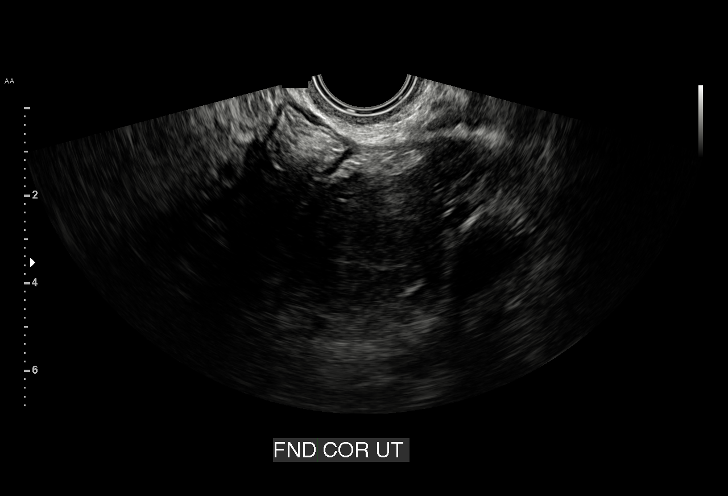
[im 13/29]
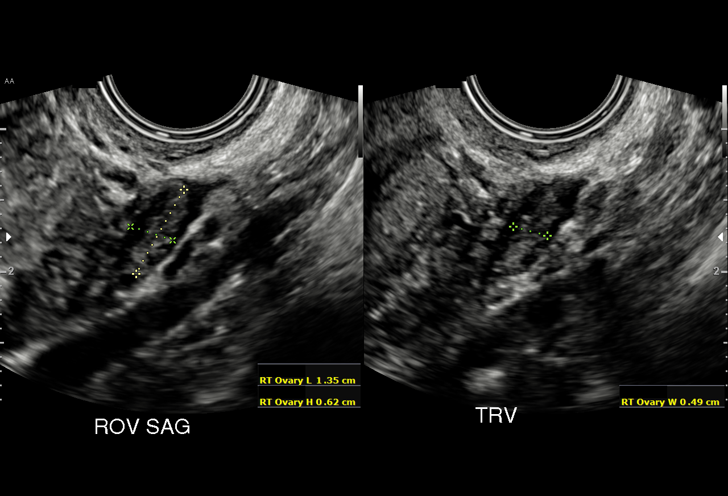
[im 15/29]
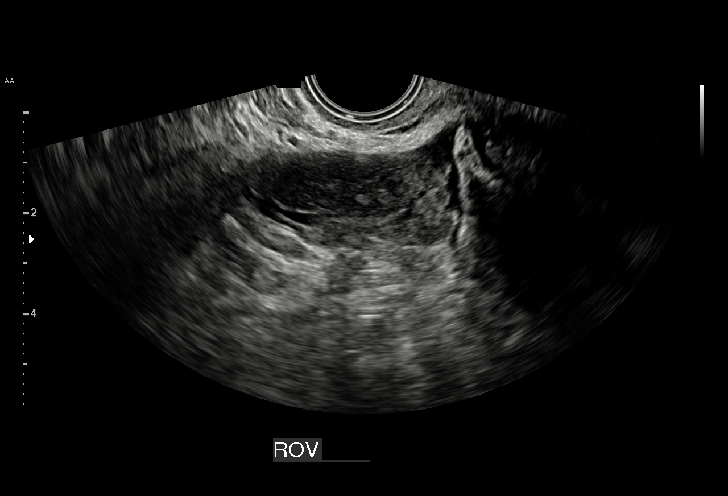
[im 16/29]
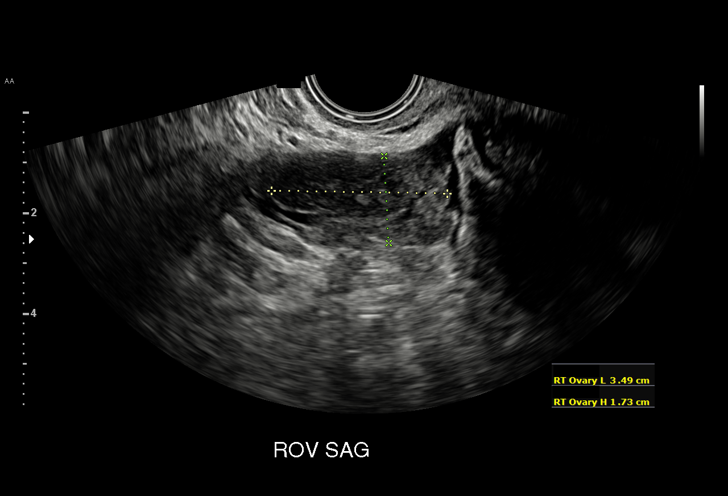
[im 18/29]
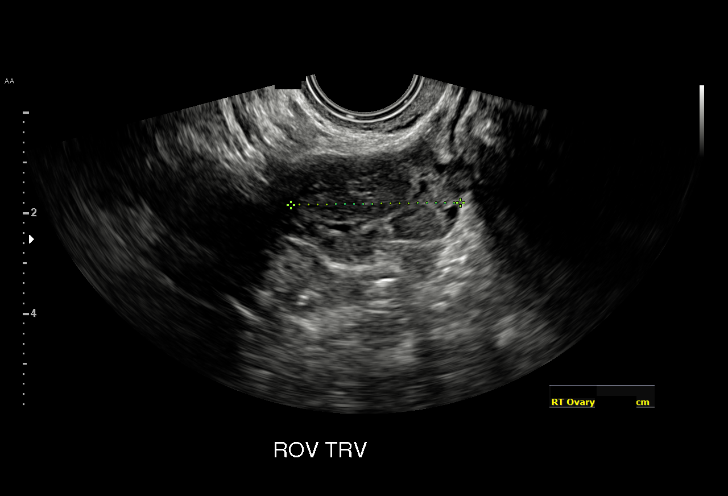
[im 20/29]
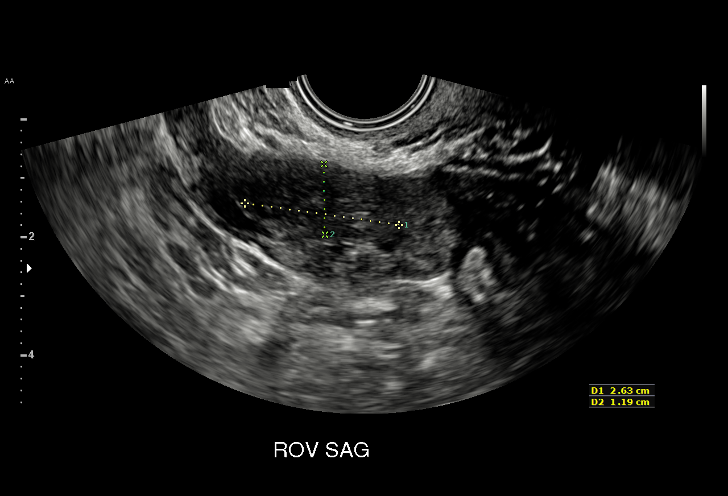
[im 22/29]
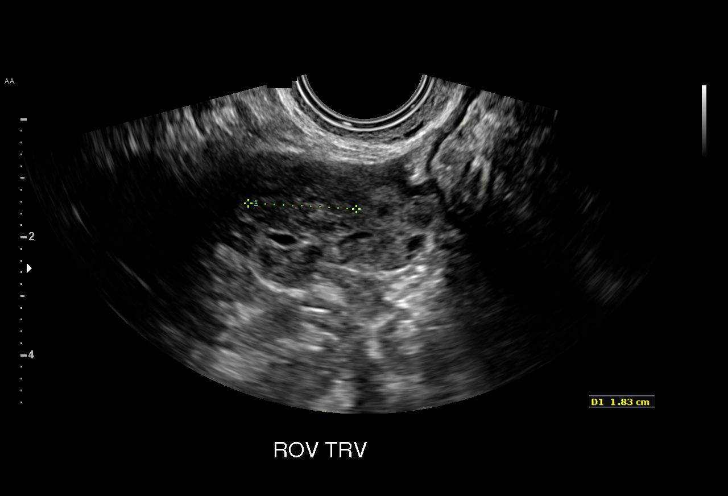
[im 24/29]
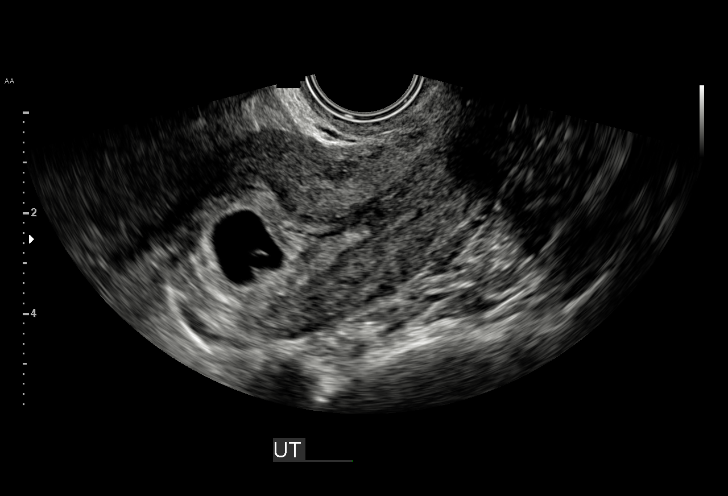
[im 26/29]
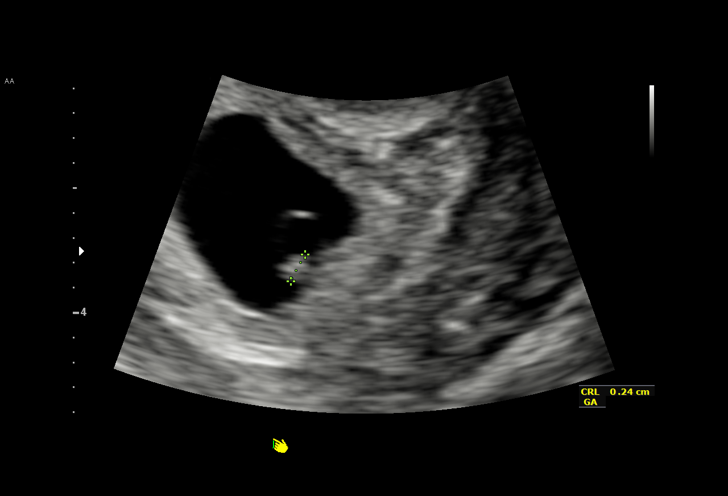
[im 29/29]
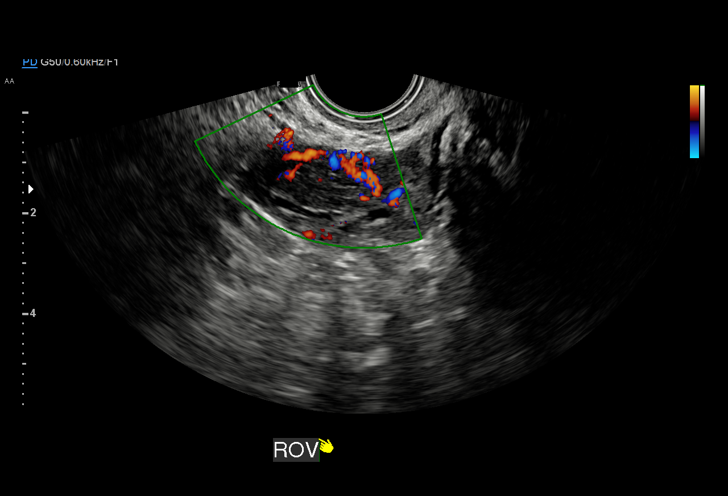

[15 of 28 positions shown; findings below may reference images not displayed]

FINDINGS: Intrauterine gestational sac: Single intrauterine gestational sac
appears normal in size, shape and position.

Yolk sac:  Present.

Embryo:  Present.

Embryonic Cardiac Activity: Regular rate and rhythm.

Embryonic Heart Rate: 115 bpm

CRL:   2.5  mm   5 w 5 d                  US EDC: 05/24/2017

Subchorionic hemorrhage:  None visualized.

Maternal uterus/adnexae: Left ovary measures 1.4 x 0.6 x 0.5 cm.
Right ovary measures 3.5 x 1.7 x 3.4 cm and contains a 2.6 cm corpus
luteum (which has decreased in size from 6.1 cm on 09/23/2016). No
suspicious ovarian or adnexal masses. No abnormal free fluid in the
pelvis.
IMPRESSION: 1. Single living intrauterine gestation at 5 weeks 5 days by
crown-rump length, with no significant discrepancy with the expected
gestational age of 6 weeks 2 days by provided menstrual dating.
Normal embryonic cardiac activity.
2. No suspicious ovarian or adnexal findings. Right ovarian corpus
luteum has significantly decreased in size in the interval.

## 2018-04-29 DIAGNOSIS — M545 Low back pain: Secondary | ICD-10-CM | POA: Diagnosis not present

## 2018-04-29 DIAGNOSIS — M546 Pain in thoracic spine: Secondary | ICD-10-CM | POA: Diagnosis not present

## 2018-04-30 DIAGNOSIS — M545 Low back pain: Secondary | ICD-10-CM | POA: Diagnosis not present

## 2018-05-01 ENCOUNTER — Other Ambulatory Visit: Payer: Self-pay | Admitting: Orthopedic Surgery

## 2018-05-01 DIAGNOSIS — M545 Low back pain: Secondary | ICD-10-CM

## 2018-05-10 ENCOUNTER — Ambulatory Visit
Admission: RE | Admit: 2018-05-10 | Discharge: 2018-05-10 | Disposition: A | Discharge status: Home / Self Care | Payer: Medicaid Other | Source: Ambulatory Visit | Attending: Orthopedic Surgery | Admitting: Orthopedic Surgery

## 2018-05-10 DIAGNOSIS — M48061 Spinal stenosis, lumbar region without neurogenic claudication: Secondary | ICD-10-CM | POA: Diagnosis not present

## 2018-05-10 DIAGNOSIS — M545 Low back pain: Secondary | ICD-10-CM

## 2018-05-28 DIAGNOSIS — M545 Low back pain: Secondary | ICD-10-CM | POA: Diagnosis not present

## 2018-05-28 DIAGNOSIS — M47816 Spondylosis without myelopathy or radiculopathy, lumbar region: Secondary | ICD-10-CM | POA: Diagnosis not present

## 2018-05-28 DIAGNOSIS — M546 Pain in thoracic spine: Secondary | ICD-10-CM | POA: Diagnosis not present

## 2018-05-31 DIAGNOSIS — M546 Pain in thoracic spine: Secondary | ICD-10-CM | POA: Diagnosis not present

## 2018-05-31 DIAGNOSIS — M545 Low back pain: Secondary | ICD-10-CM | POA: Diagnosis not present

## 2018-06-05 DIAGNOSIS — M545 Low back pain: Secondary | ICD-10-CM | POA: Diagnosis not present

## 2018-06-05 DIAGNOSIS — M546 Pain in thoracic spine: Secondary | ICD-10-CM | POA: Diagnosis not present

## 2018-06-12 DIAGNOSIS — M546 Pain in thoracic spine: Secondary | ICD-10-CM | POA: Diagnosis not present

## 2018-06-12 DIAGNOSIS — M545 Low back pain: Secondary | ICD-10-CM | POA: Diagnosis not present

## 2018-06-17 DIAGNOSIS — M546 Pain in thoracic spine: Secondary | ICD-10-CM | POA: Diagnosis not present

## 2018-06-17 DIAGNOSIS — M545 Low back pain: Secondary | ICD-10-CM | POA: Diagnosis not present

## 2018-06-27 DIAGNOSIS — Z79899 Other long term (current) drug therapy: Secondary | ICD-10-CM | POA: Diagnosis not present

## 2018-07-01 DIAGNOSIS — Z6841 Body Mass Index (BMI) 40.0 and over, adult: Secondary | ICD-10-CM | POA: Diagnosis not present

## 2018-07-01 DIAGNOSIS — Z01419 Encounter for gynecological examination (general) (routine) without abnormal findings: Secondary | ICD-10-CM | POA: Diagnosis not present

## 2018-07-01 DIAGNOSIS — Z124 Encounter for screening for malignant neoplasm of cervix: Secondary | ICD-10-CM | POA: Diagnosis not present

## 2018-07-02 DIAGNOSIS — Z124 Encounter for screening for malignant neoplasm of cervix: Secondary | ICD-10-CM | POA: Diagnosis not present

## 2018-10-13 ENCOUNTER — Ambulatory Visit: Payer: Self-pay | Admitting: Neurology

## 2018-10-22 ENCOUNTER — Other Ambulatory Visit: Payer: Self-pay

## 2018-10-22 ENCOUNTER — Encounter (HOSPITAL_COMMUNITY): Payer: Self-pay | Admitting: Emergency Medicine

## 2018-10-22 ENCOUNTER — Ambulatory Visit (HOSPITAL_COMMUNITY)
Admission: EM | Admit: 2018-10-22 | Discharge: 2018-10-22 | Disposition: A | Discharge status: Home / Self Care | Payer: 59 | Attending: Family Medicine | Admitting: Family Medicine

## 2018-10-22 DIAGNOSIS — M5489 Other dorsalgia: Secondary | ICD-10-CM

## 2018-10-22 DIAGNOSIS — M7918 Myalgia, other site: Secondary | ICD-10-CM

## 2018-10-22 DIAGNOSIS — M542 Cervicalgia: Secondary | ICD-10-CM | POA: Diagnosis not present

## 2018-10-22 MED ORDER — CYCLOBENZAPRINE HCL 10 MG PO TABS: 10.0000 mg | ORAL_TABLET | Freq: Two times a day (BID) | ORAL | 0 refills | Status: DC | PRN

## 2018-10-22 MED ORDER — KETOROLAC TROMETHAMINE 30 MG/ML IJ SOLN
INTRAMUSCULAR | Status: AC
  Filled 2018-10-22: qty 1

## 2018-10-22 MED ORDER — KETOROLAC TROMETHAMINE 30 MG/ML IJ SOLN
30.0000 mg | Freq: Once | INTRAMUSCULAR | Status: AC
  Administered 2018-10-22: 30 mg via INTRAMUSCULAR

## 2018-10-22 MED ORDER — NAPROXEN 500 MG PO TABS: 500.0000 mg | ORAL_TABLET | Freq: Two times a day (BID) | ORAL | 0 refills | Status: DC

## 2018-10-22 NOTE — Discharge Instructions (Addendum)
I believe you  have some strained muscles, soreness. I have sent flexeril to the pharmacy. You can take this as needed but mainly at bedtime. This will cause drowsiness and naproxen for pain inflammation you can take this twice a day with food. Gentle stretching, heat, massage the areas can help Follow up as needed for continued or worsening symptoms
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
## 2018-11-25 DIAGNOSIS — R52 Pain, unspecified: Secondary | ICD-10-CM | POA: Diagnosis not present

## 2018-11-25 DIAGNOSIS — R070 Pain in throat: Secondary | ICD-10-CM | POA: Diagnosis not present

## 2018-11-25 DIAGNOSIS — R05 Cough: Secondary | ICD-10-CM | POA: Diagnosis not present

## 2019-04-05 ENCOUNTER — Other Ambulatory Visit: Payer: Self-pay

## 2019-04-05 ENCOUNTER — Encounter (HOSPITAL_COMMUNITY): Payer: Self-pay | Admitting: Family Medicine

## 2019-04-05 ENCOUNTER — Ambulatory Visit (HOSPITAL_COMMUNITY)
Admission: EM | Admit: 2019-04-05 | Discharge: 2019-04-05 | Disposition: A | Discharge status: Home / Self Care | Payer: 59 | Attending: Family Medicine | Admitting: Family Medicine

## 2019-04-05 DIAGNOSIS — J302 Other seasonal allergic rhinitis: Secondary | ICD-10-CM | POA: Diagnosis not present

## 2019-04-05 MED ORDER — METHYLPREDNISOLONE ACETATE 80 MG/ML IJ SUSP
80.0000 mg | Freq: Once | INTRAMUSCULAR | Status: AC
  Administered 2019-04-05: 14:00:00 80 mg via INTRAMUSCULAR

## 2019-04-05 MED ORDER — FEXOFENADINE HCL 180 MG PO TABS: 180.0000 mg | ORAL_TABLET | Freq: Every day | ORAL | 1 refills | Status: DC

## 2019-04-05 MED ORDER — CETIRIZINE HCL 10 MG PO TABS: 10.0000 mg | ORAL_TABLET | Freq: Every day | ORAL | 1 refills | Status: DC | PRN

## 2019-04-05 MED ORDER — METHYLPREDNISOLONE ACETATE 80 MG/ML IJ SUSP
INTRAMUSCULAR | Status: AC
  Filled 2019-04-05: qty 1

## 2019-04-05 MED ORDER — MONTELUKAST SODIUM 10 MG PO TABS: 10.0000 mg | ORAL_TABLET | Freq: Every day | ORAL | 1 refills | Status: DC

## 2019-04-05 NOTE — Discharge Instructions (Signed)
You will probably benefit from switching from one antihistamine to another each week.  If Singulair (Montelukast) causes depression, discontinue using it.

## 2019-04-05 NOTE — ED Provider Notes (Signed)
[facility]    CSN: [id number] Arrival date & time: 04/05/19  1255     History   Chief Complaint Chief Complaint  Patient presents with  . Nasal Congestion    HPI Karina Nelson is a 28 y.o. female.   This is a 28 year old established patient presenting to [facility] urgent care for evaluation of allergy symptoms.  She has a very lengthy history of severe allergies that affects just her sinuses, her eyes, and her nose.  She has had about 3 weeks of persistent symptoms including itchy eyes and sneezing.  She had some epistaxis recently.  Patient continues to work for [employer] and [employer].  She used to see Dr. Jethro Bolus.     Past Medical History:  Diagnosis Date  . Anemia   . Asthma   . Epilepsy (HCC)   . Seasonal allergies   . SVD (spontaneous vaginal delivery) 05/07/2017    Patient Active Problem List   Diagnosis Date Noted  . SVD (spontaneous vaginal delivery) 05/07/2017  . PIH (pregnancy induced hypertension), third trimester 05/06/2017  . Localization-related (focal) (partial) idiopathic epilepsy and epileptic syndromes with seizures of localized onset, not intractable, without status epilepticus (HCC) 11/17/2015  . Frequent nosebleeds 09/21/2014  . Insomnia 03/09/2014  . 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 Encounter for long-term (current) use of other medications 07/08/2013  . Morbid obesity (HCC) 07/08/2013  . Attention deficit disorder with hyperactivity(314.01) 07/08/2013  . Migraine without aura 07/08/2013    Past Surgical History:  Procedure Laterality Date  . OTHER SURGICAL HISTORY     cyst removal behind left ear    OB History    Gravida  1   Para  1   Term  1   Preterm      AB      Living  1     SAB      TAB      Ectopic      Multiple  0   Live Births  1            Home Medications    Prior to Admission medications   Medication Sig Start Date End Date Taking? Authorizing Provider   cetirizine (ZYRTEC) 10 MG tablet Take 1 tablet (10 mg total) by mouth daily as needed for allergies. 04/05/19   Elvina Sidle, MD  fexofenadine (ALLEGRA) 180 MG tablet Take 1 tablet (180 mg total) by mouth daily. 04/05/19   Elvina Sidle, MD  folic acid (FOLVITE) 800 MCG tablet Take 2,400 mcg by mouth daily.     [provider]  lamoTRIgine (LAMICTAL) 100 MG tablet Take 400 mg by mouth 2 (two) times daily.     [provider]  montelukast (SINGULAIR) 10 MG tablet Take 1 tablet (10 mg total) by mouth at bedtime. 04/05/19   Elvina Sidle, MD  Prenatal Vit-Fe Fumarate-FA (PRENATAL MULTIVITAMIN) TABS tablet Take 1 tablet by mouth daily at 12 noon.    [provider]    Family History Family History  Problem Relation Age of Onset  . Hypertension Mother   . Cancer Father   . Heart disease Father   . Diabetes Paternal Grandfather   . Cancer Paternal Aunt     Social History Social History   Tobacco Use  . Smoking status: Former Smoker    Types: Cigars  . Smokeless tobacco: Never Used  . Tobacco comment: 1-2 cigars a day  Substance Use Topics  . Alcohol use: No  Alcohol/week: 0.0 standard drinks    Comment: patient stopped drinking due to pregnancy  . Drug use: No     Allergies   Food; Rice; Pineapple; Sudafed [pseudoephedrine]; and Banana   Review of Systems Review of Systems  Constitutional: Negative.   HENT: Positive for congestion and rhinorrhea.   Eyes: Positive for itching.  Respiratory: Negative.   Cardiovascular: Negative.   Gastrointestinal: Negative.   All other systems reviewed and are negative.    Physical Exam Triage Vital Signs ED Triage Vitals  Enc Vitals Group     BP      Pulse      Resp      Temp      Temp src      SpO2      Weight      Height      Head Circumference      Peak Flow      Pain Score      Pain Loc      Pain Edu?      Excl. in GC?    No data found.  Updated Vital Signs BP 127/87 (BP Location:  Right Arm)   Pulse 91   Temp 98.2 F (36.8 C) (Oral)   Resp 18   LMP 03/31/2019   SpO2 97%    Physical Exam Vitals signs and nursing note reviewed.  Constitutional:      Appearance: Normal appearance. She is obese. She is not ill-appearing.  HENT:     Head: Normocephalic.     Nose: Congestion present.     Mouth/Throat:     Mouth: Mucous membranes are moist.  Eyes:     Comments: Mildly injected conjunctivae  Neck:     Musculoskeletal: Normal range of motion and neck supple.  Cardiovascular:     Rate and Rhythm: Normal rate and regular rhythm.     Heart sounds: Normal heart sounds.  Pulmonary:     Effort: Pulmonary effort is normal.     Breath sounds: Normal breath sounds.  Musculoskeletal: Normal range of motion.  Skin:    General: Skin is warm and dry.  Neurological:     General: No focal deficit present.     Mental Status: She is alert.  Psychiatric:        Mood and Affect: Mood normal.        Behavior: Behavior normal.      UC Treatments / Results  Labs (all labs ordered are listed, but only abnormal results are displayed) Labs Reviewed - No data to display  EKG None  Radiology No results found.  Procedures Procedures (including critical care time)  Medications Ordered in UC Medications  methylPREDNISolone acetate (DEPO-MEDROL) injection 80 mg (has no administration in time range)    Initial Impression / Assessment and Plan / UC Course  I have reviewed the triage vital signs and the nursing notes.  Pertinent labs & imaging results that were available during my care of the patient were reviewed by me and considered in my medical decision making (see chart for details).    Final Clinical Impressions(s) / UC Diagnoses   Final diagnoses:  Seasonal allergies   Discharge Instructions   None    ED Prescriptions    Medication Sig Dispense Auth. Provider   cetirizine (ZYRTEC) 10 MG tablet Take 1 tablet (10 mg total) by mouth daily as needed for  allergies. 90 tablet Elvina Sidle, MD   fexofenadine (ALLEGRA) 180 MG tablet Take 1 tablet (180  mg total) by mouth daily. 30 tablet Elvina Sidle, MD   montelukast (SINGULAIR) 10 MG tablet Take 1 tablet (10 mg total) by mouth at bedtime. 60 tablet Elvina Sidle, MD     Controlled Substance Prescriptions [place] Controlled Substance Registry consulted? Not Applicable   Elvina Sidle, MD 04/05/19 1331

## 2019-04-05 NOTE — ED Triage Notes (Signed)
The patient presented to the [facility] with a complaint of chronic allergy symptoms and stated that her current allergy medication is not working.

## 2019-07-10 ENCOUNTER — Encounter: Payer: Self-pay | Admitting: Emergency Medicine

## 2019-07-10 ENCOUNTER — Other Ambulatory Visit: Payer: Self-pay

## 2019-07-10 ENCOUNTER — Ambulatory Visit
Admission: EM | Admit: 2019-07-10 | Discharge: 2019-07-10 | Disposition: A | Discharge status: Home / Self Care | Payer: 59 | Attending: Physician Assistant | Admitting: Physician Assistant

## 2019-07-10 ENCOUNTER — Telehealth: Payer: Self-pay | Admitting: Physician Assistant

## 2019-07-10 DIAGNOSIS — R1013 Epigastric pain: Secondary | ICD-10-CM

## 2019-07-10 LAB — COMPREHENSIVE METABOLIC PANEL
ALT: 23 IU/L (ref 0–32)
AST: 19 IU/L (ref 0–40)
Albumin/Globulin Ratio: 1.7 (ref 1.2–2.2)
Albumin: 4.3 g/dL (ref 3.9–5.0)
Alkaline Phosphatase: 92 IU/L (ref 39–117)
BUN/Creatinine Ratio: 8 — ABNORMAL LOW (ref 9–23)
BUN: 6 mg/dL (ref 6–20)
Bilirubin Total: 0.3 mg/dL (ref 0.0–1.2)
CO2: 27 mmol/L (ref 20–29)
Calcium: 9.6 mg/dL (ref 8.7–10.2)
Chloride: 104 mmol/L (ref 96–106)
Creatinine, Ser: 0.77 mg/dL (ref 0.57–1.00)
GFR calc Af Amer: 121 mL/min/{1.73_m2} (ref >59)
GFR calc non Af Amer: 105 mL/min/{1.73_m2} (ref >59)
Globulin, Total: 2.5 g/dL (ref 1.5–4.5)
Glucose: 108 mg/dL — ABNORMAL HIGH (ref 65–99)
Potassium: 4 mmol/L (ref 3.5–5.2)
Sodium: 135 mmol/L (ref 134–144)
Total Protein: 6.8 g/dL (ref 6.0–8.5)

## 2019-07-10 LAB — LIPASE: Lipase: 28 U/L (ref 14–72)

## 2019-07-10 MED ORDER — ONDANSETRON 4 MG PO TBDP: 4.0000 mg | ORAL_TABLET | Freq: Three times a day (TID) | ORAL | 0 refills | Status: DC | PRN

## 2019-07-10 MED ORDER — OMEPRAZOLE 20 MG PO CPDR: 20.0000 mg | DELAYED_RELEASE_CAPSULE | Freq: Every day | ORAL | 0 refills | Status: DC

## 2019-07-10 MED ORDER — ALUM & MAG HYDROXIDE-SIMETH 200-200-20 MG/5ML PO SUSP
30.0000 mL | Freq: Once | ORAL | Status: AC
  Administered 2019-07-10: 11:00:00 30 mL via ORAL

## 2019-07-10 MED ORDER — LIDOCAINE VISCOUS HCL 2 % MT SOLN
15.0000 mL | Freq: Once | OROMUCOSAL | Status: AC
  Administered 2019-07-10: 15 mL via ORAL

## 2019-07-10 MED ORDER — SUCRALFATE 1 GM/10ML PO SUSP: 1.0000 g | Freq: Three times a day (TID) | ORAL | 0 refills | Status: DC

## 2019-07-10 NOTE — ED Triage Notes (Signed)
Pt presents to [facility] for assessment of epigastric pain x 2 days.  Small amount of emesis this morning.  C/o some reflux.  Pt does heavy lifting at work.  Pt still has her gallbladder.

## 2019-07-10 NOTE — Telephone Encounter (Signed)
Called patient to inform of lab results.  Normal labs, to continue with plan as discussed.  Patient states since GI cocktail wore off, has been having significant pain to the epigastric region.  She took the omeprazole and Zofran about an hour ago, has not had any significant relief.  She is wondering if she should go to the emergency department for further evaluation.  Discussed with patient, on exam no alarming sign, may not have significant benefits to following up at the emergency department at this time.  Call in Carafate for patient to try for symptomatic relief.  However, if continues with worsening abdominal pain, unable to jump up and down due to pain, nausea/vomiting not controlled by medication, to go to the ED for further evaluation and management needed.  Patient expresses understanding and agrees to plan.

## 2019-07-10 NOTE — ED Notes (Signed)
Patient monitored for effectiveness of GI cocktail.  States her symptoms are "getting better".    Patient able to ambulate independently

## 2019-07-10 NOTE — Discharge Instructions (Signed)
Zofran for nausea and vomiting as needed. Omeprazole for possible acid reflux causing symptoms. Keep hydrated, you urine should be clear to pale yellow in color. Bland diet, advance as tolerated. Monitor for any worsening of symptoms, nausea or vomiting not controlled by medication, worsening abdominal pain, fever, follow-up for reevaluation.

## 2019-07-10 NOTE — ED Provider Notes (Signed)
[facility]    CSN: [id number] Arrival date & time: 07/10/19  1000     History   Chief Complaint Chief Complaint  Patient presents with  . Abdominal Pain    HPI [facility] is a 28 y.o. female.   28 year old female with history of seasonal allergies, asthma, epilepsy comes in for 2 day history of epigastric pain. Intermittent, sharp in sensation. No precipitating factor. No aggravating or alleviating factor. Had decreased appetite, but tried to eat yesterday without changes in abdominal pain. Nausea with small amount of emesis this morning. Mild diarrhea the past 2 days. Denies fever, chills, night sweats. Has mild rhinorrhea chronically due to allergies. Denies cough, sore throat, body aches. Former smoker. 1 soda daily, no other caffeine intake. Occasional alcohol on weekends. No chronic NSAID use. No drug use. No new medications or changes in medications.      Past Medical History:  Diagnosis Date  . Anemia   . Asthma   . Epilepsy ([place])   . Seasonal allergies   . SVD (spontaneous vaginal delivery) 05/07/2017    Patient Active Problem List   Diagnosis Date Noted  . SVD (spontaneous vaginal delivery) 05/07/2017  . PIH (pregnancy induced hypertension), third trimester 05/06/2017  . Localization-related (focal) (partial) idiopathic epilepsy and epileptic syndromes with seizures of localized onset, not intractable, without status epilepticus ([place]) 11/17/2015  . Frequent nosebleeds 09/21/2014  . Insomnia 03/09/2014  . 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 Encounter for long-term (current) use of other medications 07/08/2013  . Morbid obesity ([place]) 07/08/2013  . Attention deficit disorder with hyperactivity(314.01) 07/08/2013  . Migraine without aura 07/08/2013    Past Surgical History:  Procedure Laterality Date  . OTHER SURGICAL HISTORY     cyst removal behind left ear    OB History    Gravida  1   Para  1   Term  1   Preterm      AB      Living  1     SAB      TAB      Ectopic      Multiple  0   Live Births  1            Home Medications    Prior to Admission medications   Medication Sig Start Date End Date Taking? Authorizing Provider  lamoTRIgine (LAMICTAL) 100 MG tablet Take 400 mg by mouth 2 (two) times daily.    Yes [provider]  cetirizine (ZYRTEC) 10 MG tablet Take 1 tablet (10 mg total) by mouth daily as needed for allergies. 04/05/19   Robyn Haber, MD  fexofenadine (ALLEGRA) 180 MG tablet Take 1 tablet (180 mg total) by mouth daily. 04/05/19   Robyn Haber, MD  montelukast (SINGULAIR) 10 MG tablet Take 1 tablet (10 mg total) by mouth at bedtime. 04/05/19   Robyn Haber, MD  omeprazole (PRILOSEC) 20 MG capsule Take 1 capsule (20 mg total) by mouth daily. 07/10/19   Tasia Catchings, Amy V, PA-C  ondansetron (ZOFRAN ODT) 4 MG disintegrating tablet Take 1 tablet (4 mg total) by mouth every 8 (eight) hours as needed for nausea or vomiting. 07/10/19   Ok Edwards, PA-C    Family History Family History  Problem Relation Age of Onset  . Hypertension Mother   . Cancer Father   . Heart disease Father   . Diabetes Paternal Grandfather   . Cancer Paternal Aunt     Social History  Social History   Tobacco Use  . Smoking status: Former Smoker    Types: Cigars  . Smokeless tobacco: Never Used  . Tobacco comment: 1-2 cigars a day  Substance Use Topics  . Alcohol use: Yes    Alcohol/week: 0.0 standard drinks    Comment: social  . Drug use: No     Allergies   Food, Rice, Pineapple, Sudafed [pseudoephedrine], and Banana   Review of Systems Review of Systems  Reason unable to perform ROS: See HPI as above.     Physical Exam Triage Vital Signs ED Triage Vitals [07/10/19 1011]  Enc Vitals Group     BP (!) 126/93     Pulse Rate 85     Resp 18     Temp 99.2 F (37.3 C)     Temp Source Oral     SpO2 99 %     Weight      Height      Head Circumference      Peak Flow       Pain Score 10     Pain Loc      Pain Edu?      Excl. in GC?    No data found.  Updated Vital Signs BP (!) 126/93 (BP Location: Left Arm)   Pulse 85   Temp 99.2 F (37.3 C) (Oral)   Resp 18   LMP 06/18/2019   SpO2 99%   Physical Exam Constitutional:      General: She is not in acute distress.    Appearance: She is well-developed. She is not ill-appearing, toxic-appearing or diaphoretic.  HENT:     Head: Normocephalic and atraumatic.  Eyes:     Conjunctiva/sclera: Conjunctivae normal.     Pupils: Pupils are equal, round, and reactive to light.  Cardiovascular:     Rate and Rhythm: Normal rate and regular rhythm.     Heart sounds: Normal heart sounds. No murmur. No friction rub. No gallop.   Pulmonary:     Effort: Pulmonary effort is normal.     Breath sounds: Normal breath sounds. No wheezing or rales.  Abdominal:     General: Bowel sounds are normal.     Palpations: Abdomen is soft.     Tenderness: There is abdominal tenderness in the epigastric area. There is no right CVA tenderness, left CVA tenderness, guarding or rebound. Negative signs include Murphy's sign.  Skin:    General: Skin is warm and dry.  Neurological:     Mental Status: She is alert and oriented to person, place, and time.  Psychiatric:        Behavior: Behavior normal.        Judgment: Judgment normal.    UC Treatments / Results  Labs (all labs ordered are listed, but only abnormal results are displayed) Labs Reviewed  COMPREHENSIVE METABOLIC PANEL  LIPASE    EKG   Radiology No results found.  Procedures Procedures (including critical care time)  Medications Ordered in UC Medications  alum & mag hydroxide-simeth (MAALOX/MYLANTA) 200-200-20 MG/5ML suspension 30 mL (30 mLs Oral Given 07/10/19 1040)    And  lidocaine (XYLOCAINE) 2 % viscous mouth solution 15 mL (15 mLs Oral Given 07/10/19 1040)    Initial Impression / Assessment and Plan / UC Course  I have reviewed the triage vital  signs and the nursing notes.  Pertinent labs & imaging results that were available during my care of the patient were reviewed by me and considered in my  medical decision making (see chart for details).    No alarming signs on exam. Discussed possible causes of epigastric pain, including viral gastritis, GERD, PUD, liver/pancreatic problems. Will draw CMP, lipase for further evaluation. GI cocktail provided with good relief. Will provide omeprazole and zofran at this time. Push fluids. Bland diet, advance as tolerated. Return precautions given. Patient expresses understanding and agrees to plan.  Final Clinical Impressions(s) / UC Diagnoses   Final diagnoses:  Abdominal pain, epigastric   ED Prescriptions    Medication Sig Dispense Auth. Provider   omeprazole (PRILOSEC) 20 MG capsule Take 1 capsule (20 mg total) by mouth daily. 14 capsule Yu, Amy V, PA-C   ondansetron (ZOFRAN ODT) 4 MG disintegrating tablet Take 1 tablet (4 mg total) by mouth every 8 (eight) hours as needed for nausea or vomiting. 15 tablet Threasa AlphaYu, Amy V, PA-C        Yu, Amy V, [place] 07/10/19 1635

## 2019-12-06 IMAGING — MR MR LUMBAR SPINE W/O CM
4 of 5 series · 25 of 48 positions shown · non-contrast
Comparison: None.

CLINICAL DATA: Right-sided back pain since December 2017. Lifting
injury at work. Right hip pain for 1 month.

EXAM:
MRI LUMBAR SPINE WITHOUT CONTRAST
TECHNIQUE: Multiplanar, multisequence MR imaging of the lumbar spine was
performed. No intravenous contrast was administered.

[Series 3: T2 · sagittal · 4.0mm · 0.55mm/px · 6 of 13 slices shown (1 of 2)]
[im 1/13]
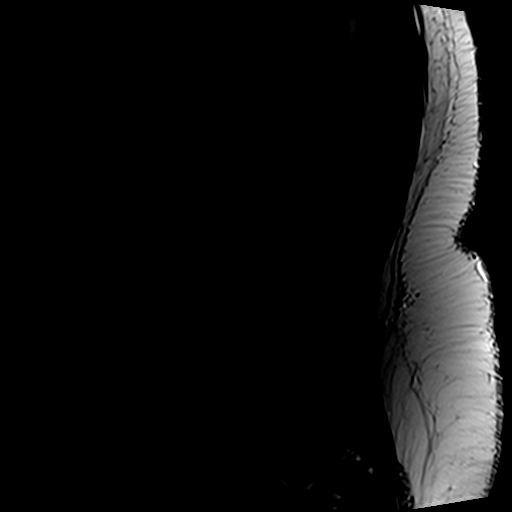
[im 3/13]
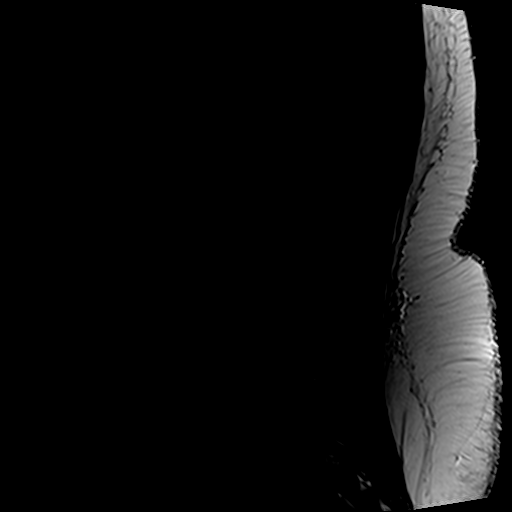
[im 5/13]
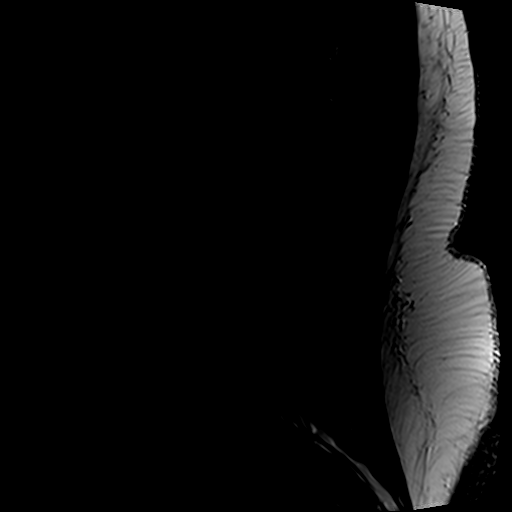
[im 8/13]
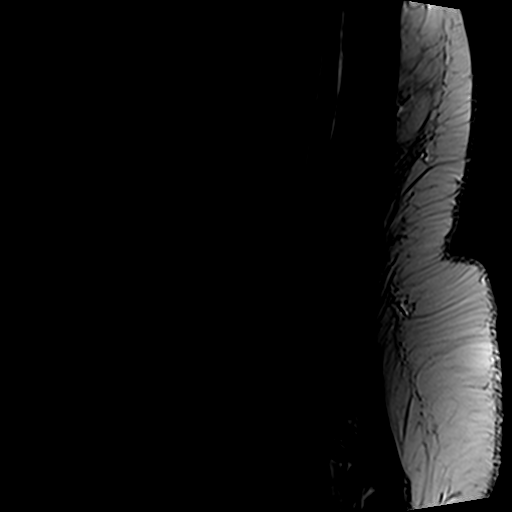
[im 10/13]
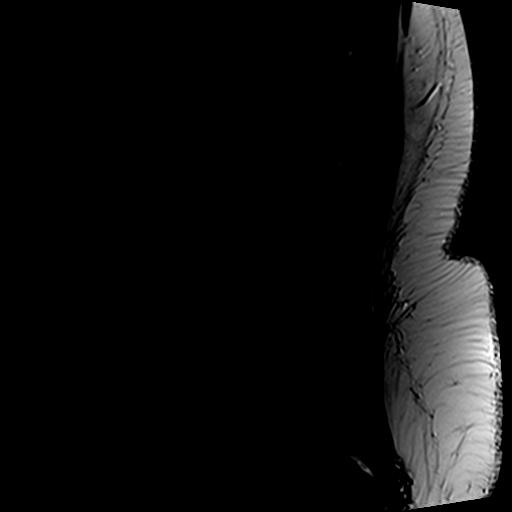
[im 13/13]
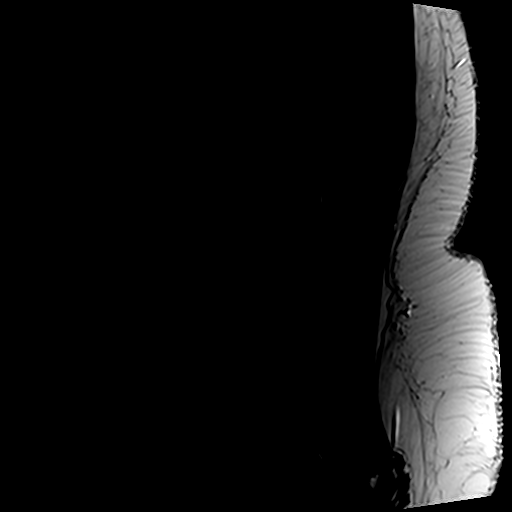

[Series 5: T1 · sagittal · 4.0mm · 0.55mm/px · 6 of 13 slices shown (1 of 2)]
[im 1/13]
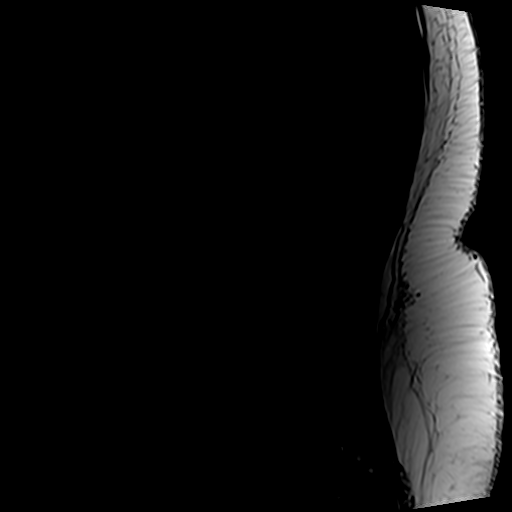
[im 3/13]
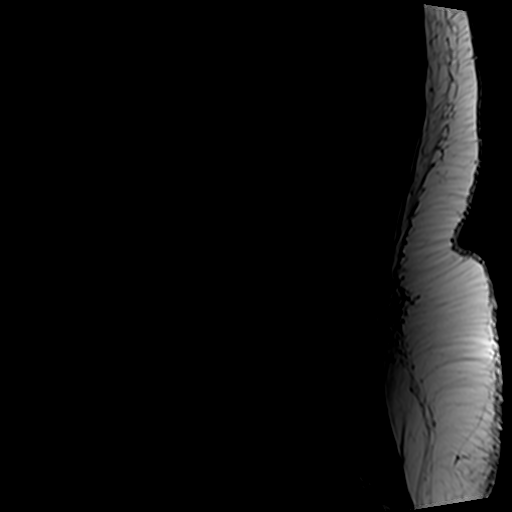
[im 5/13]
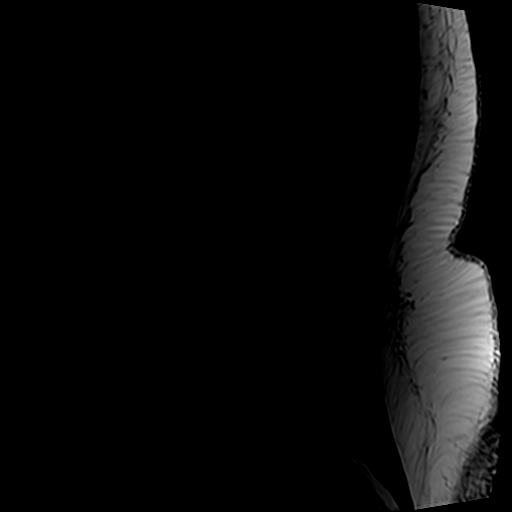
[im 8/13]
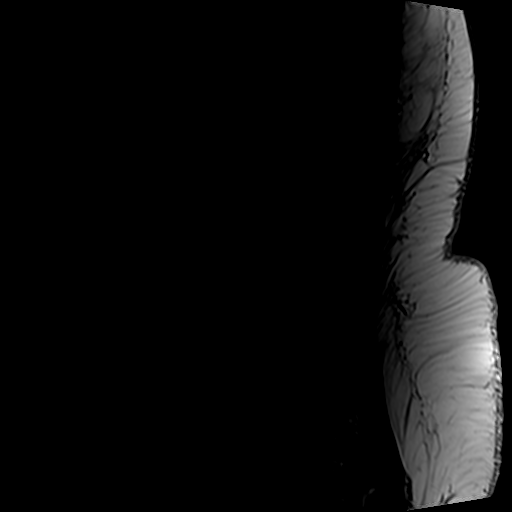
[im 10/13]
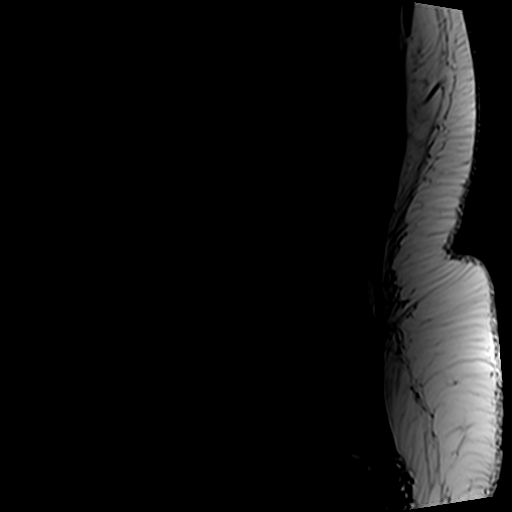
[im 13/13]
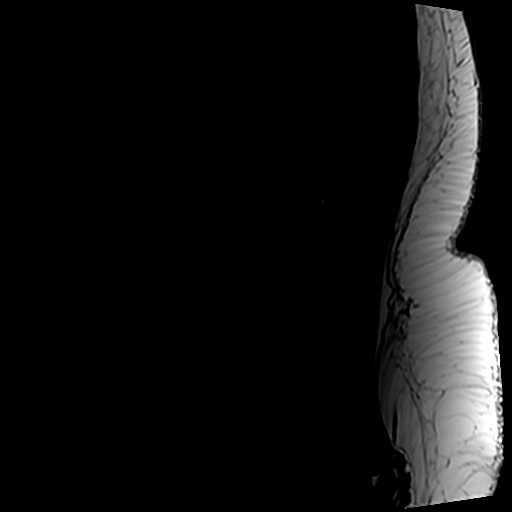

[Series 6: T2 · axial · 4.0mm · 0.70mm/px · z∈[-69,+137]mm · 9 of 35 slices shown (2 of 2)]
[im 1/35]
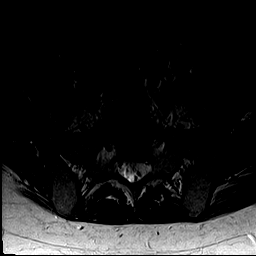
[im 5/35]
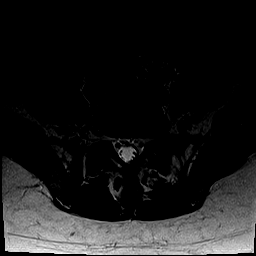
[im 10/35]
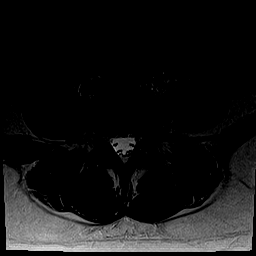
[im 15/35]
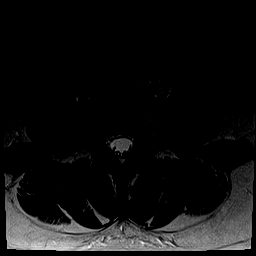
[im 18/35]
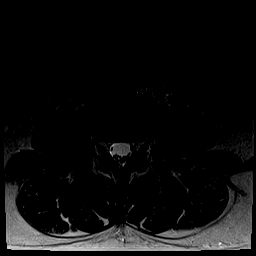
[im 20/35]
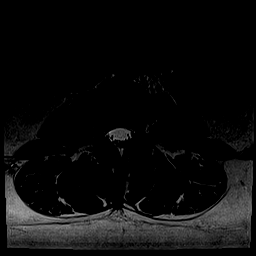
[im 25/35]
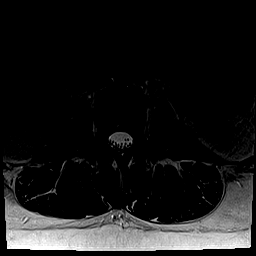
[im 30/35]
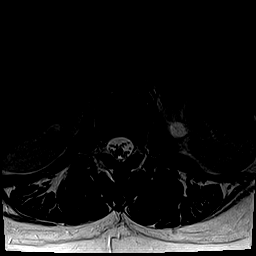
[im 35/35]
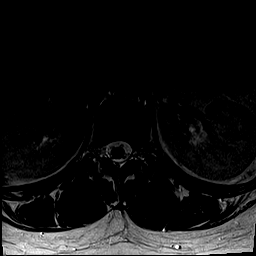

[Series 7: T1 · axial · 4.0mm · 0.35mm/px · z∈[-69,+111]mm · 4 of 35 slices shown (2 of 2)]
[im 1/35]
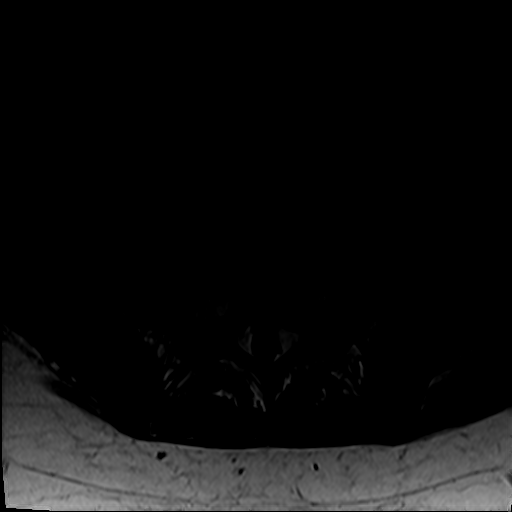
[im 5/35]
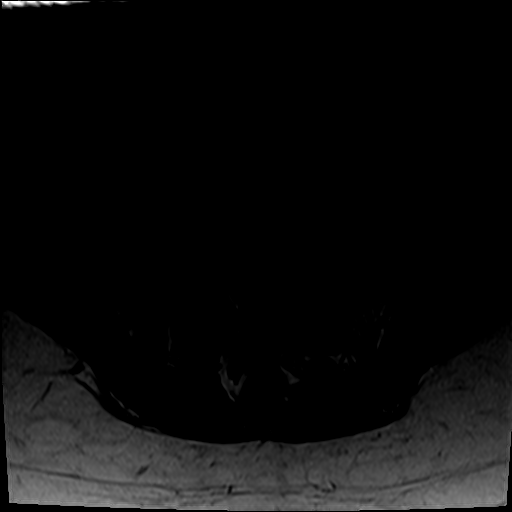
[im 18/35]
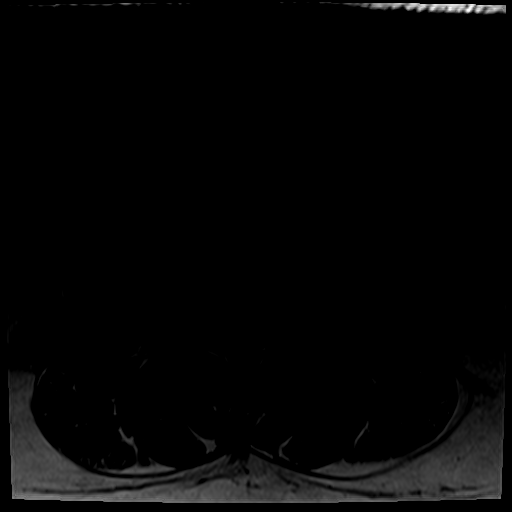
[im 30/35]
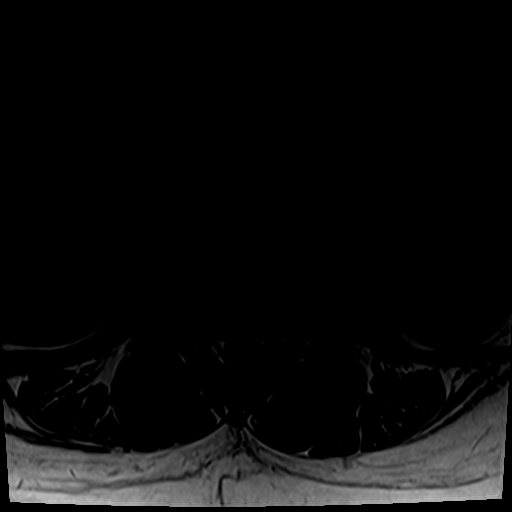

[25 of 48 positions shown; findings below may reference images not displayed]

FINDINGS: Segmentation:  Standard.

Alignment:  Physiologic.

Vertebrae:  No fracture, evidence of discitis, or bone lesion.

Conus medullaris and cauda equina: Conus extends to the L1 level.
Conus and cauda equina appear normal.

Paraspinal and other soft tissues: No acute paraspinal abnormality.

Disc levels:

Disc spaces: Degenerative disc disease with disc desiccation at L4-5
and L5-S1 with reactive endplate changes at L4-5. Mild disc height
loss at L4-5.

T12-L1: Mild broad-based disc bulge. No evidence of neural foraminal
stenosis. No central canal stenosis.

L1-L2: Minimal broad-based disc bulge. No evidence of neural
foraminal stenosis. No central canal stenosis.

L2-L3: No significant disc bulge. No evidence of neural foraminal
stenosis. No central canal stenosis.

L3-L4: No significant disc bulge. No evidence of neural foraminal
stenosis. No central canal stenosis.

L4-L5: Minimal broad-based disc bulge. No evidence of neural
foraminal stenosis. No central canal stenosis.

L5-S1: No significant disc bulge. Severe bilateral facet
arthropathy. Mild right foraminal stenosis. No left foraminal
stenosis. No central canal stenosis.
IMPRESSION: 1. No significant lumbar spine disc protrusion, foraminal stenosis
or central canal stenosis.
2. At L5-S1 there is severe bilateral facet arthropathy. Mild right
foraminal stenosis.

## 2020-01-12 ENCOUNTER — Other Ambulatory Visit: Payer: Self-pay

## 2020-01-12 ENCOUNTER — Emergency Department (HOSPITAL_BASED_OUTPATIENT_CLINIC_OR_DEPARTMENT_OTHER)
Admission: EM | Admit: 2020-01-12 | Discharge: 2020-01-12 | Disposition: A | Discharge status: Home / Self Care | Payer: Medicaid Other | Attending: Emergency Medicine | Admitting: Emergency Medicine

## 2020-01-12 ENCOUNTER — Encounter (HOSPITAL_BASED_OUTPATIENT_CLINIC_OR_DEPARTMENT_OTHER): Payer: Self-pay

## 2020-01-12 DIAGNOSIS — Y939 Activity, unspecified: Secondary | ICD-10-CM | POA: Insufficient documentation

## 2020-01-12 DIAGNOSIS — Z79899 Other long term (current) drug therapy: Secondary | ICD-10-CM | POA: Insufficient documentation

## 2020-01-12 DIAGNOSIS — Z888 Allergy status to other drugs, medicaments and biological substances status: Secondary | ICD-10-CM | POA: Insufficient documentation

## 2020-01-12 DIAGNOSIS — F17290 Nicotine dependence, other tobacco product, uncomplicated: Secondary | ICD-10-CM | POA: Insufficient documentation

## 2020-01-12 DIAGNOSIS — X58XXXA Exposure to other specified factors, initial encounter: Secondary | ICD-10-CM | POA: Insufficient documentation

## 2020-01-12 DIAGNOSIS — Z91018 Allergy to other foods: Secondary | ICD-10-CM | POA: Insufficient documentation

## 2020-01-12 DIAGNOSIS — K0889 Other specified disorders of teeth and supporting structures: Secondary | ICD-10-CM

## 2020-01-12 DIAGNOSIS — K011 Impacted teeth: Secondary | ICD-10-CM | POA: Insufficient documentation

## 2020-01-12 DIAGNOSIS — S025XXA Fracture of tooth (traumatic), initial encounter for closed fracture: Secondary | ICD-10-CM | POA: Insufficient documentation

## 2020-01-12 DIAGNOSIS — Y929 Unspecified place or not applicable: Secondary | ICD-10-CM | POA: Insufficient documentation

## 2020-01-12 DIAGNOSIS — Y999 Unspecified external cause status: Secondary | ICD-10-CM | POA: Insufficient documentation

## 2020-01-12 DIAGNOSIS — J45909 Unspecified asthma, uncomplicated: Secondary | ICD-10-CM | POA: Insufficient documentation

## 2020-01-12 DIAGNOSIS — G40909 Epilepsy, unspecified, not intractable, without status epilepticus: Secondary | ICD-10-CM | POA: Insufficient documentation

## 2020-01-12 MED ORDER — PENICILLIN V POTASSIUM 250 MG PO TABS
500.0000 mg | ORAL_TABLET | Freq: Once | ORAL | Status: AC
  Administered 2020-01-12: 23:00:00 500 mg via ORAL
  Filled 2020-01-12: qty 2

## 2020-01-12 MED ORDER — NAPROXEN 375 MG PO TABS: 375.0000 mg | ORAL_TABLET | Freq: Two times a day (BID) | ORAL | 0 refills | Status: DC

## 2020-01-12 MED ORDER — PENICILLIN V POTASSIUM 500 MG PO TABS: 500.0000 mg | ORAL_TABLET | Freq: Four times a day (QID) | ORAL | 0 refills | Status: DC

## 2020-01-12 MED ORDER — IBUPROFEN 800 MG PO TABS
800.0000 mg | ORAL_TABLET | Freq: Once | ORAL | Status: AC
  Administered 2020-01-12: 800 mg via ORAL
  Filled 2020-01-12: qty 1

## 2020-01-12 NOTE — ED Triage Notes (Signed)
Dental pain since last night

## 2020-01-13 ENCOUNTER — Encounter (HOSPITAL_BASED_OUTPATIENT_CLINIC_OR_DEPARTMENT_OTHER): Payer: Self-pay | Admitting: Emergency Medicine

## 2020-01-13 NOTE — ED Provider Notes (Signed)
[facility] EMERGENCY DEPARTMENT Provider Note   CSN: [id number] Arrival date & time: 01/12/20  2135     History Chief Complaint  Patient presents with  . Dental Pain    [facility] is a 29 y.o. female.  The history is provided by the patient.  Dental Pain Location:  Lower Lower teeth location:  17/LL 3rd molar and 18/LL 2nd molar Quality:  Aching Severity:  Severe Onset quality:  Gradual Duration:  1 day Timing:  Constant Progression:  Unchanged Chronicity:  New Context comment:  Wisdom tooth is pushing on molar ahead of it Previous work-up:  Dental exam Relieved by:  Nothing Worsened by:  Nothing Ineffective treatments:  None tried Associated symptoms: no facial swelling, no fever, no neck pain, no neck swelling, no oral bleeding and no trismus   Risk factors: no alcohol problem        Past Medical History:  Diagnosis Date  . Anemia   . Asthma   . Epilepsy ([facility])   . Seasonal allergies   . SVD (spontaneous vaginal delivery) 05/07/2017    Patient Active Problem List   Diagnosis Date Noted  . SVD (spontaneous vaginal delivery) 05/07/2017  . PIH (pregnancy induced hypertension), third trimester 05/06/2017  . Localization-related (focal) (partial) idiopathic epilepsy and epileptic syndromes with seizures of localized onset, not intractable, without status epilepticus ([facility]) 11/17/2015  . Frequent nosebleeds 09/21/2014  . Insomnia 03/09/2014  . 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 Encounter for long-term (current) use of other medications 07/08/2013  . Morbid obesity ([facility]) 07/08/2013  . Attention deficit disorder with hyperactivity(314.01) 07/08/2013  . Migraine without aura 07/08/2013    Past Surgical History:  Procedure Laterality Date  . OTHER SURGICAL HISTORY     cyst removal behind left ear     OB History    Gravida  1   Para  1   Term  1   Preterm      AB      Living  1     SAB      TAB      Ectopic      Multiple  0   Live Births  1           Family History  Problem Relation Age of Onset  . Hypertension Mother   . Cancer Father   . Heart disease Father   . Diabetes Paternal Grandfather   . Cancer Paternal Aunt     Social History   Tobacco Use  . Smoking status: Current Some Day Smoker    Types: Cigars  . Smokeless tobacco: Never Used  . Tobacco comment: 1-2 cigars a day  Substance Use Topics  . Alcohol use: Yes    Alcohol/week: 0.0 standard drinks    Comment: social  . Drug use: Yes    Types: Marijuana    Home Medications Prior to Admission medications   Medication Sig Start Date End Date Taking? Authorizing Provider  cetirizine (ZYRTEC) 10 MG tablet Take 1 tablet (10 mg total) by mouth daily as needed for allergies. 04/05/19   Elvina Sidle, MD  fexofenadine (ALLEGRA) 180 MG tablet Take 1 tablet (180 mg total) by mouth daily. 04/05/19   Elvina Sidle, MD  lamoTRIgine (LAMICTAL) 100 MG tablet Take 400 mg by mouth 2 (two) times daily.     [provider]  montelukast (SINGULAIR) 10 MG tablet Take 1 tablet (10 mg total) by mouth at bedtime. 04/05/19   Elvina Sidle, MD  naproxen (NAPROSYN) 375 MG tablet Take 1 tablet (375 mg total) by mouth 2 (two) times daily. 01/12/20   Kroy Sprung, MD  omeprazole (PRILOSEC) 20 MG capsule Take 1 capsule (20 mg total) by mouth daily. 07/10/19   Cathie Hoops, Amy V, PA-C  ondansetron (ZOFRAN ODT) 4 MG disintegrating tablet Take 1 tablet (4 mg total) by mouth every 8 (eight) hours as needed for nausea or vomiting. 07/10/19   Cathie Hoops, Amy V, PA-C  penicillin v potassium (VEETID) 500 MG tablet Take 1 tablet (500 mg total) by mouth 4 (four) times daily. 01/12/20   Samaiya Awadallah, MD  sucralfate (CARAFATE) 1 GM/10ML suspension Take 10 mLs (1 g total) by mouth 4 (four) times daily -  with meals and at bedtime for 5 days. 07/10/19 07/15/19  Belinda Fisher, PA-C    Allergies    Food, Rice, Pineapple, Sudafed [pseudoephedrine], and Banana  Review of  Systems   Review of Systems  Constitutional: Negative for fever.  HENT: Positive for dental problem. Negative for facial swelling.   Eyes: Negative for visual disturbance.  Respiratory: Negative for shortness of breath.   Cardiovascular: Negative for chest pain.  Gastrointestinal: Negative for abdominal pain.  Genitourinary: Negative for difficulty urinating.  Musculoskeletal: Negative for neck pain.  Neurological: Negative for weakness.  Psychiatric/Behavioral: Negative for agitation.  All other systems reviewed and are negative.   Physical Exam Updated Vital Signs BP 119/86 (BP Location: Left Arm)   Pulse 76   Temp 98.4 F (36.9 C) (Oral)   Resp 16   Ht 5\' 5"  (1.651 m)   Wt 117.9 kg   LMP 01/01/2020   SpO2 100%   BMI 43.27 kg/m   Physical Exam Vitals and nursing note reviewed.  Constitutional:      General: She is not in acute distress.    Appearance: Normal appearance.  HENT:     Head: Normocephalic and atraumatic.     Nose: Nose normal.     Mouth/Throat:     Comments: Impacted wisdom tooth with cracked LL second molar Eyes:     Conjunctiva/sclera: Conjunctivae normal.     Pupils: Pupils are equal, round, and reactive to light.  Cardiovascular:     Rate and Rhythm: Normal rate and regular rhythm.     Pulses: Normal pulses.     Heart sounds: Normal heart sounds.  Pulmonary:     Effort: Pulmonary effort is normal.     Breath sounds: Normal breath sounds.  Abdominal:     General: Abdomen is flat.     Tenderness: There is no abdominal tenderness. There is no guarding.  Musculoskeletal:        General: Normal range of motion.     Cervical back: Normal range of motion and neck supple.  Skin:    General: Skin is warm and dry.     Capillary Refill: Capillary refill takes less than 2 seconds.  Neurological:     General: No focal deficit present.     Mental Status: She is alert and oriented to person, place, and time.  Psychiatric:        Mood and Affect: Mood  normal.        Behavior: Behavior normal.     ED Results / Procedures / Treatments   Labs (all labs ordered are listed, but only abnormal results are displayed) Labs Reviewed - No data to display  EKG None  Radiology No results found.  Procedures Procedures (including critical care time)  Medications Ordered  in ED Medications  penicillin v potassium (VEETID) tablet 500 mg (500 mg Oral Given 01/12/20 2319)  ibuprofen (ADVIL) tablet 800 mg (800 mg Oral Given 01/12/20 2319)    ED Course  I have reviewed the triage vital signs and the nursing notes.  Pertinent labs & imaging results that were available during my care of the patient were reviewed by me and considered in my medical decision making (see chart for details).   Will treat with NSAIDs and PCN and will refer to dentistry for ongoing imaging and care.    [facility] was evaluated in Emergency Department on 01/13/2020 for the symptoms described in the history of present illness. She was evaluated in the context of the global COVID-19 pandemic, which necessitated consideration that the patient might be at risk for infection with the SARS-CoV-2 virus that causes COVID-19. Institutional protocols and algorithms that pertain to the evaluation of patients at risk for COVID-19 are in a state of rapid change based on information released by regulatory bodies including the CDC and federal and state organizations. These policies and algorithms were followed during the patient's care in the ED.  Final Clinical Impression(s) / ED Diagnoses Final diagnoses:  Pain, dental  Closed fracture of tooth, initial encounter   Return for intractable cough, coughing up blood,fevers >100.4 unrelieved by medication, shortness of breath, intractable vomiting, chest pain, shortness of breath, weakness,numbness, changes in speech, facial asymmetry,abdominal pain, passing out,Inability to tolerate liquids or food, cough, altered mental status or  any concerns. No signs of systemic illness or infection. The patient is nontoxic-appearing on exam and vital signs are within normal limits.   I have reviewed the triage vital signs and the nursing notes. Pertinent labs &imaging results that were available during my care of the patient were reviewed by me and considered in my medical decision making (see chart for details).  After history, exam, and medical workup I feel the patient has been appropriately medically screened and is safe for discharge home. Pertinent diagnoses were discussed with the patient. Patient was given return    Rx / DC Orders ED Discharge Orders         Ordered    penicillin v potassium (VEETID) 500 MG tablet  4 times daily     01/12/20 2336    naproxen (NAPROSYN) 375 MG tablet  2 times daily     01/12/20 2336           Toneshia Coello, MD 01/13/20 5329

## 2020-06-06 ENCOUNTER — Emergency Department (HOSPITAL_COMMUNITY)
Admission: EM | Admit: 2020-06-06 | Discharge: 2020-06-06 | Disposition: A | Discharge status: Home / Self Care | Payer: Self-pay | Attending: Emergency Medicine | Admitting: Emergency Medicine

## 2020-06-06 ENCOUNTER — Other Ambulatory Visit: Payer: Self-pay

## 2020-06-06 DIAGNOSIS — Z79899 Other long term (current) drug therapy: Secondary | ICD-10-CM | POA: Insufficient documentation

## 2020-06-06 DIAGNOSIS — R569 Unspecified convulsions: Secondary | ICD-10-CM | POA: Insufficient documentation

## 2020-06-06 DIAGNOSIS — Z720 Tobacco use: Secondary | ICD-10-CM | POA: Insufficient documentation

## 2020-06-06 LAB — CBC WITH DIFFERENTIAL/PLATELET
Abs Immature Granulocytes: 0.02 10*3/uL (ref 0.00–0.07)
Basophils Absolute: 0 10*3/uL (ref 0.0–0.1)
Basophils Relative: 0 %
Eosinophils Absolute: 0.3 10*3/uL (ref 0.0–0.5)
Eosinophils Relative: 4 %
HCT: 35.8 % — ABNORMAL LOW (ref 36.0–46.0)
Hemoglobin: 11.9 g/dL — ABNORMAL LOW (ref 12.0–15.0)
Immature Granulocytes: 0 %
Lymphocytes Relative: 23 %
Lymphs Abs: 1.6 10*3/uL (ref 0.7–4.0)
MCH: 29.6 pg (ref 26.0–34.0)
MCHC: 33.2 g/dL (ref 30.0–36.0)
MCV: 89.1 fL (ref 80.0–100.0)
Monocytes Absolute: 0.6 10*3/uL (ref 0.1–1.0)
Monocytes Relative: 8 %
Neutro Abs: 4.4 10*3/uL (ref 1.7–7.7)
Neutrophils Relative %: 65 %
Platelets: 170 10*3/uL (ref 150–400)
RBC: 4.02 MIL/uL (ref 3.87–5.11)
RDW: 15.4 % (ref 11.5–15.5)
WBC: 6.9 10*3/uL (ref 4.0–10.5)
nRBC: 0 % (ref 0.0–0.2)

## 2020-06-06 LAB — URINALYSIS, ROUTINE W REFLEX MICROSCOPIC
Bacteria, UA: NONE SEEN
Bilirubin Urine: NEGATIVE
Glucose, UA: NEGATIVE mg/dL
Hgb urine dipstick: NEGATIVE
Ketones, ur: NEGATIVE mg/dL
Leukocytes,Ua: NEGATIVE
Nitrite: NEGATIVE
Protein, ur: 100 mg/dL — AB
Specific Gravity, Urine: 1.02 (ref 1.005–1.030)
pH: 5 (ref 5.0–8.0)

## 2020-06-06 LAB — COMPREHENSIVE METABOLIC PANEL
ALT: 36 U/L (ref 0–44)
AST: 31 U/L (ref 15–41)
Albumin: 3.2 g/dL — ABNORMAL LOW (ref 3.5–5.0)
Alkaline Phosphatase: 78 U/L (ref 38–126)
Anion gap: 8 (ref 5–15)
BUN: 6 mg/dL (ref 6–20)
CO2: 23 mmol/L (ref 22–32)
Calcium: 8.3 mg/dL — ABNORMAL LOW (ref 8.9–10.3)
Chloride: 107 mmol/L (ref 98–111)
Creatinine, Ser: 0.71 mg/dL (ref 0.44–1.00)
GFR calc Af Amer: >60 mL/min (ref >60)
GFR calc non Af Amer: >60 mL/min (ref >60)
Glucose, Bld: 96 mg/dL (ref 70–99)
Potassium: 3.9 mmol/L (ref 3.5–5.1)
Sodium: 138 mmol/L (ref 135–145)
Total Bilirubin: 0.5 mg/dL (ref 0.3–1.2)
Total Protein: 6.6 g/dL (ref 6.5–8.1)

## 2020-06-06 LAB — PREGNANCY, URINE: Preg Test, Ur: NEGATIVE

## 2020-06-06 MED ORDER — SODIUM CHLORIDE 0.9 % IV BOLUS
1000.0000 mL | Freq: Once | INTRAVENOUS | Status: AC
  Administered 2020-06-06: 1000 mL via INTRAVENOUS

## 2020-06-06 MED ORDER — LAMOTRIGINE 100 MG PO TABS
200.0000 mg | ORAL_TABLET | Freq: Once | ORAL | Status: AC
  Administered 2020-06-06: 200 mg via ORAL
  Filled 2020-06-06: qty 2

## 2020-06-06 MED ORDER — IBUPROFEN 400 MG PO TABS
600.0000 mg | ORAL_TABLET | Freq: Once | ORAL | Status: AC
  Administered 2020-06-06: 600 mg via ORAL
  Filled 2020-06-06: qty 1

## 2020-06-06 NOTE — ED Triage Notes (Signed)
Hx of chronic back, currently rates at a "7".  Lat noted Sz 8 years ago.  Alert and oriented x 4.  Pt slightly drowsy.

## 2020-06-06 NOTE — ED Triage Notes (Signed)
Pt has been off Lamictal x 2 days and had a seizure this AM.  Reports she felt her body start shaking and notified her cousin to call EMS. Sz witnessed by cousin lasting approx 5 min with no injuries noted.  BG 92  Tachy on arrival in the 120's.  No meds given by EMS.

## 2020-06-06 NOTE — Discharge Instructions (Addendum)
Resume taking your Lamictal as previously prescribed.  No driving until cleared by your neurologist or primary doctor.  Return to the ER if you experience any new and/or concerning symptoms.

## 2020-06-06 NOTE — ED Provider Notes (Signed)
[facility] EMERGENCY DEPARTMENT Provider Note   CSN: [id number] Arrival date & time: 06/06/20  9381     History No chief complaint on file.   Karina Nelson is a 29 y.o. female.  Patient is a 29 year old female with past medical history of asthma and seizure disorder.  Patient has been on Lamictal and has not had a seizure in the past 8 years.  She states for the past couple of days, she has not taken her medication.  This morning she woke up feeling strangely, then felt a seizure coming on.  She yelled to her cousin who was in the next room, then she began shaking all over and lost consciousness.  She reports biting her tongue and losing control of her bladder.  This lasted for approximately 5 minutes, then resolved spontaneously.  Her cousin described to EMS that she was "foaming at the mouth".  Patient arrives here awake and alert with no specific complaints.  She denies any recent illness.  She denies any drug or alcohol use.  The history is provided by the patient.       Past Medical History:  Diagnosis Date  . Anemia   . Asthma   . Epilepsy (HCC)   . Seasonal allergies   . SVD (spontaneous vaginal delivery) 05/07/2017    Patient Active Problem List   Diagnosis Date Noted  . SVD (spontaneous vaginal delivery) 05/07/2017  . PIH (pregnancy induced hypertension), third trimester 05/06/2017  . Localization-related (focal) (partial) idiopathic epilepsy and epileptic syndromes with seizures of localized onset, not intractable, without status epilepticus (HCC) 11/17/2015  . Frequent nosebleeds 09/21/2014  . Insomnia 03/09/2014  . 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 Encounter for long-term (current) use of other medications 07/08/2013  . Morbid obesity (HCC) 07/08/2013  . Attention deficit disorder with hyperactivity(314.01) 07/08/2013  . Migraine without aura 07/08/2013    Past Surgical History:  Procedure Laterality Date  . OTHER SURGICAL  HISTORY     cyst removal behind left ear     OB History    Gravida  1   Para  1   Term  1   Preterm      AB      Living  1     SAB      TAB      Ectopic      Multiple  0   Live Births  1           Family History  Problem Relation Age of Onset  . Hypertension Mother   . Cancer Father   . Heart disease Father   . Diabetes Paternal Grandfather   . Cancer Paternal Aunt     Social History   Tobacco Use  . Smoking status: Current Some Day Smoker    Types: Cigars  . Smokeless tobacco: Never Used  . Tobacco comment: 1-2 cigars a day  Substance Use Topics  . Alcohol use: Yes    Alcohol/week: 0.0 standard drinks    Comment: social  . Drug use: Yes    Types: Marijuana    Home Medications Prior to Admission medications   Medication Sig Start Date End Date Taking? Authorizing Provider  cetirizine (ZYRTEC) 10 MG tablet Take 1 tablet (10 mg total) by mouth daily as needed for allergies. 04/05/19   Elvina Sidle, MD  fexofenadine (ALLEGRA) 180 MG tablet Take 1 tablet (180 mg total) by mouth daily. 04/05/19   Elvina Sidle, MD  lamoTRIgine (LAMICTAL)  100 MG tablet Take 400 mg by mouth 2 (two) times daily.     [provider]  montelukast (SINGULAIR) 10 MG tablet Take 1 tablet (10 mg total) by mouth at bedtime. 04/05/19   Robyn Haber, MD  naproxen (NAPROSYN) 375 MG tablet Take 1 tablet (375 mg total) by mouth 2 (two) times daily. 01/12/20   Palumbo, April, MD  omeprazole (PRILOSEC) 20 MG capsule Take 1 capsule (20 mg total) by mouth daily. 07/10/19   Tasia Catchings, Amy V, PA-C  ondansetron (ZOFRAN ODT) 4 MG disintegrating tablet Take 1 tablet (4 mg total) by mouth every 8 (eight) hours as needed for nausea or vomiting. 07/10/19   Tasia Catchings, Amy V, PA-C  penicillin v potassium (VEETID) 500 MG tablet Take 1 tablet (500 mg total) by mouth 4 (four) times daily. 01/12/20   Palumbo, April, MD  sucralfate (CARAFATE) 1 GM/10ML suspension Take 10 mLs (1 g total) by mouth 4 (four)  times daily -  with meals and at bedtime for 5 days. 07/10/19 07/15/19  Ok Edwards, PA-C    Allergies    Food, Rice, Pineapple, Sudafed [pseudoephedrine], and Banana  Review of Systems   Review of Systems  All other systems reviewed and are negative.   Physical Exam Updated Vital Signs There were no vitals taken for this visit.  Physical Exam Vitals and nursing note reviewed.  Constitutional:      General: She is not in acute distress.    Appearance: She is well-developed. She is not diaphoretic.  HENT:     Head: Normocephalic and atraumatic.     Mouth/Throat:     Mouth: Mucous membranes are moist.  Eyes:     Extraocular Movements: Extraocular movements intact.     Pupils: Pupils are equal, round, and reactive to light.  Cardiovascular:     Rate and Rhythm: Normal rate and regular rhythm.     Heart sounds: No murmur. No friction rub. No gallop.   Pulmonary:     Effort: Pulmonary effort is normal. No respiratory distress.     Breath sounds: Normal breath sounds. No wheezing.  Abdominal:     General: Bowel sounds are normal. There is no distension.     Palpations: Abdomen is soft.     Tenderness: There is no abdominal tenderness.  Musculoskeletal:        General: Normal range of motion.     Cervical back: Normal range of motion and neck supple.  Skin:    General: Skin is warm and dry.  Neurological:     General: No focal deficit present.     Mental Status: She is alert and oriented to person, place, and time.     Cranial Nerves: No cranial nerve deficit.     Sensory: No sensory deficit.     Motor: No weakness.     Coordination: Coordination normal.     ED Results / Procedures / Treatments   Labs (all labs ordered are listed, but only abnormal results are displayed) Labs Reviewed  URINALYSIS, ROUTINE W REFLEX MICROSCOPIC  PREGNANCY, URINE  CBC WITH DIFFERENTIAL/PLATELET  COMPREHENSIVE METABOLIC PANEL  LAMOTRIGINE LEVEL    EKG None  Radiology No results  found.  Procedures Procedures (including critical care time)  Medications Ordered in ED Medications  sodium chloride 0.9 % bolus 1,000 mL (has no administration in time range)    ED Course  I have reviewed the triage vital signs and the nursing notes.  Pertinent labs & imaging results that  were available during my care of the patient were reviewed by me and considered in my medical decision making (see chart for details).    MDM Rules/Calculators/A&P  Patient with history of seizure disorder presenting after experiencing a seizure at home.  She has missed her Lamictal dose for the past 2 days.  Patient arrives here neurologically intact with stable vital signs.  Her work-up shows normal electrolytes, negative pregnancy test, and pending Lamictal level.  Care discussed with Dr. Amada Jupiter from neurology.  He recommends restarting her medication at her current dose.  This was given here in the ER and patient seems appropriate for discharge.  She is to follow-up with primary doctor/neurologist.  Final Clinical Impression(s) / ED Diagnoses Final diagnoses:  None    Rx / DC Orders ED Discharge Orders    None       Geoffery Lyons, MD 06/06/20 1240

## 2020-06-07 LAB — LAMOTRIGINE LEVEL: Lamotrigine Lvl: <1 ug/mL — ABNORMAL LOW (ref 2.0–20.0)

## 2020-12-27 ENCOUNTER — Emergency Department (HOSPITAL_COMMUNITY)
Admission: EM | Admit: 2020-12-27 | Discharge: 2020-12-27 | Discharge status: Against Medical Advice | Payer: Managed Care, Other (non HMO)

## 2020-12-31 NOTE — L&D Delivery Note (Signed)
DELIVERY NOTE  Pt complete and at +2 station with urge to push.  Pt pushed and delivered a viable female infant in direct OA, compound with RUE position. Anterior and posterior shoulders spontaneously delivered with next two pushes; body easily followed next. Infant placed on mothers abdomen and bulb suction of mouth and nose performed. Cord was then clamped and cut by FOB. Cord blood obtained, 3VC. Baby had a vigorous spontaneous cry noted. Placenta then delivered at 0232 intact. Fundal massage performed and pitocin per protocol. Fundus firm. The following lacerations were noted: right labial. Repaired in routine fashion with 4-0 monocryl in figure of eight, EBL 400cc. Mother and baby stable. Counts correct   Infant time: 0225 Gender: female Placenta time: 0232 Apgars:9/9 Weight: pending skin-to-skin

## 2021-01-10 ENCOUNTER — Other Ambulatory Visit: Payer: Managed Care, Other (non HMO)

## 2021-01-11 ENCOUNTER — Other Ambulatory Visit: Payer: Managed Care, Other (non HMO)

## 2021-01-11 DIAGNOSIS — Z20822 Contact with and (suspected) exposure to covid-19: Secondary | ICD-10-CM

## 2021-01-14 LAB — NOVEL CORONAVIRUS, NAA: SARS-CoV-2, NAA: NOT DETECTED

## 2021-01-14 LAB — SARS-COV-2, NAA 2 DAY TAT

## 2021-02-15 ENCOUNTER — Ambulatory Visit
Admission: EM | Admit: 2021-02-15 | Discharge: 2021-02-15 | Disposition: A | Discharge status: Home / Self Care | Payer: Managed Care, Other (non HMO) | Attending: Student | Admitting: Student

## 2021-02-15 ENCOUNTER — Other Ambulatory Visit: Payer: Self-pay

## 2021-02-15 DIAGNOSIS — Z3201 Encounter for pregnancy test, result positive: Secondary | ICD-10-CM

## 2021-02-15 LAB — POCT URINE PREGNANCY: Preg Test, Ur: POSITIVE — AB

## 2021-02-15 NOTE — ED Provider Notes (Signed)
[facility]    CSN: [id number] Arrival date & time: 02/15/21  1346      History   Chief Complaint Chief Complaint  Patient presents with  . pregnancy test    HPI [place] Epping is a 30 y.o. female Pt states had 2 positive pregnant test, needs confirmation. Denies any problems. History anemia, asthma, epilepsy, seasonal allergies, SVD. Last period 01/26/2021. Feeling well overall, denies nausea, abd pain, urinary symptoms.  HPI  Past Medical History:  Diagnosis Date  . Anemia   . Asthma   . Epilepsy (HCC)   . Seasonal allergies   . SVD (spontaneous vaginal delivery) 05/07/2017    Patient Active Problem List   Diagnosis Date Noted  . SVD (spontaneous vaginal delivery) 05/07/2017  . PIH (pregnancy induced hypertension), third trimester 05/06/2017  . Localization-related (focal) (partial) idiopathic epilepsy and epileptic syndromes with seizures of localized onset, not intractable, without status epilepticus (HCC) 11/17/2015  . Frequent nosebleeds 09/21/2014  . Insomnia 03/09/2014  . 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 Encounter for long-term (current) use of other medications 07/08/2013  . Morbid obesity (HCC) 07/08/2013  . Attention deficit disorder with hyperactivity(314.01) 07/08/2013  . Migraine without aura 07/08/2013    Past Surgical History:  Procedure Laterality Date  . OTHER SURGICAL HISTORY     cyst removal behind left ear    OB History    Gravida  1   Para  1   Term  1   Preterm      AB      Living  1     SAB      IAB      Ectopic      Multiple  0   Live Births  1            Home Medications    Prior to Admission medications   Medication Sig Start Date End Date Taking? Authorizing Provider  cetirizine (ZYRTEC) 10 MG tablet Take 1 tablet (10 mg total) by mouth daily as needed for allergies. 04/05/19   Elvina Sidle, MD  fexofenadine (ALLEGRA) 180 MG tablet Take 1 tablet (180 mg total) by mouth daily.  04/05/19   Elvina Sidle, MD  lamoTRIgine (LAMICTAL) 200 MG tablet Take 200 mg by mouth 2 (two) times daily. 02/02/20   [provider]  montelukast (SINGULAIR) 10 MG tablet Take 1 tablet (10 mg total) by mouth at bedtime. Patient taking differently: Take 10 mg by mouth as needed (allergies).  04/05/19   Elvina Sidle, MD  naproxen (NAPROSYN) 375 MG tablet Take 1 tablet (375 mg total) by mouth 2 (two) times daily. Patient not taking: Reported on 06/06/2020 01/12/20   Palumbo, April, MD  omeprazole (PRILOSEC) 20 MG capsule Take 1 capsule (20 mg total) by mouth daily. Patient not taking: Reported on 06/06/2020 07/10/19   Belinda Fisher, PA-C  ondansetron (ZOFRAN ODT) 4 MG disintegrating tablet Take 1 tablet (4 mg total) by mouth every 8 (eight) hours as needed for nausea or vomiting. Patient not taking: Reported on 06/06/2020 07/10/19   Belinda Fisher, PA-C  sucralfate (CARAFATE) 1 GM/10ML suspension Take 10 mLs (1 g total) by mouth 4 (four) times daily -  with meals and at bedtime for 5 days. Patient not taking: Reported on 06/06/2020 07/10/19 06/06/20  Belinda Fisher PA-C    Family History Family History  Problem Relation Age of Onset  . Hypertension Mother   . Cancer Father   . Heart disease  Father   . Diabetes Paternal Grandfather   . Cancer Paternal Aunt     Social History Social History   Tobacco Use  . Smoking status: Current Some Day Smoker    Types: Cigars  . Smokeless tobacco: Never Used  . Tobacco comment: 1-2 cigars a day  Vaping Use  . Vaping Use: Never used  Substance Use Topics  . Alcohol use: Yes    Alcohol/week: 0.0 standard drinks    Comment: social  . Drug use: Yes    Types: Marijuana     Allergies   Food, Rice, Pineapple, Sudafed [pseudoephedrine], and Banana   Review of Systems Review of Systems  Constitutional: Negative for appetite change, chills, diaphoresis and fever.  Respiratory: Negative for shortness of breath.   Cardiovascular: Negative for chest pain.   Gastrointestinal: Negative for abdominal pain, blood in stool, constipation, diarrhea, nausea and vomiting.  Genitourinary: Negative for decreased urine volume, difficulty urinating, dysuria, flank pain, frequency, genital sores, hematuria and urgency.  Musculoskeletal: Negative for back pain.  Neurological: Negative for dizziness, weakness and light-headedness.  All other systems reviewed and are negative.    Physical Exam Triage Vital Signs ED Triage Vitals  Enc Vitals Group     BP 02/15/21 1418 106/69     Pulse Rate 02/15/21 1418 91     Resp 02/15/21 1418 20     Temp 02/15/21 1418 98.7 F (37.1 C)     Temp Source 02/15/21 1418 Oral     SpO2 02/15/21 1418 99 %     Weight --      Height --      Head Circumference --      Peak Flow --      Pain Score 02/15/21 1424 0     Pain Loc --      Pain Edu? --      Excl. in GC? --    No data found.  Updated Vital Signs BP 106/69 (BP Location: Right Arm)   Pulse 91   Temp 98.7 F (37.1 C) (Oral)   Resp 20   LMP 01/22/2021   SpO2 99%   Breastfeeding No   Visual Acuity Right Eye Distance:   Left Eye Distance:   Bilateral Distance:    Right Eye Near:   Left Eye Near:    Bilateral Near:     Physical Exam Vitals reviewed.  Constitutional:      Appearance: Normal appearance.  Cardiovascular:     Rate and Rhythm: Normal rate and regular rhythm.     Heart sounds: Normal heart sounds.  Pulmonary:     Effort: Pulmonary effort is normal.     Breath sounds: Normal breath sounds.  Abdominal:     General: Abdomen is flat.     Palpations: Abdomen is soft.     Tenderness: There is no abdominal tenderness. There is no right CVA tenderness, left CVA tenderness, guarding or rebound.  Neurological:     General: No focal deficit present.     Mental Status: She is alert and oriented to person, place, and time.  Psychiatric:        Mood and Affect: Mood normal.        Behavior: Behavior normal.        Thought Content: Thought  content normal.      UC Treatments / Results  Labs (all labs ordered are listed, but only abnormal results are displayed) Labs Reviewed  POCT URINE PREGNANCY - Abnormal; Notable for the following  components:      Result Value   Preg Test, Ur Positive (*)    All other components within normal limits    EKG   Radiology No results found.  Procedures Procedures (including critical care time)  Medications Ordered in UC Medications - No data to display  Initial Impression / Assessment and Plan / UC Course  I have reviewed the triage vital signs and the nursing notes.  Pertinent labs & imaging results that were available during my care of the patient were reviewed by me and considered in my medical decision making (see chart for details).  Clinical Course as of 02/15/21 1517  Wed Feb 15, 2021  1506 Preg Test, Ur(!): Positive [LG]    Clinical Course User Index [LG] Rhys Martini, PA-C      This patient is a 30 year old female presenting for positive pregnancy test.  Follow-up with OB/GYN.  She already has an OB she follows with; she will call them today and schedule appt.   Spent over 30 minutes obtaining H&P, performing physical, discussing results, treatment plan and plan for follow-up with patient. Patient agrees with plan.   This chart was dictated using voice recognition software, Dragon. Despite the best efforts of this provider to proofread and correct errors, errors may still occur which can change documentation meaning.   Final Clinical Impressions(s) / UC Diagnoses   Final diagnoses:  Positive pregnancy test     Discharge Instructions     -Please make a follow-up appointment with your OB-GYN.  -Seek immediate medical attention if abdominal pain, vaginal spotting, vaginal discharge, urinary symptoms, etc.      ED Prescriptions    None     PDMP not reviewed this encounter.   Rhys Martini, PA-C 02/15/21 1517

## 2021-02-15 NOTE — ED Triage Notes (Signed)
Pt states had 2 positive pregnant test, needs confirmation. Denies any problems.

## 2021-02-15 NOTE — Discharge Instructions (Addendum)
-  Please make a follow-up appointment with your OB-GYN.  -Seek immediate medical attention if abdominal pain, vaginal spotting, vaginal discharge, urinary symptoms, etc.

## 2021-03-31 LAB — OB RESULTS CONSOLE HEPATITIS B SURFACE ANTIGEN: Hepatitis B Surface Ag: NEGATIVE

## 2021-03-31 LAB — OB RESULTS CONSOLE ABO/RH: RH Type: POSITIVE

## 2021-03-31 LAB — OB RESULTS CONSOLE HIV ANTIBODY (ROUTINE TESTING): HIV: NONREACTIVE

## 2021-03-31 LAB — OB RESULTS CONSOLE VARICELLA ZOSTER ANTIBODY, IGG: Varicella: IMMUNE

## 2021-03-31 LAB — OB RESULTS CONSOLE RUBELLA ANTIBODY, IGM: Rubella: IMMUNE

## 2021-03-31 LAB — OB RESULTS CONSOLE GC/CHLAMYDIA
Chlamydia: NEGATIVE
Gonorrhea: NEGATIVE

## 2021-03-31 LAB — OB RESULTS CONSOLE ANTIBODY SCREEN: Antibody Screen: NEGATIVE

## 2021-03-31 LAB — OB RESULTS CONSOLE RPR: RPR: NONREACTIVE

## 2021-05-10 ENCOUNTER — Other Ambulatory Visit: Payer: Self-pay

## 2021-05-10 ENCOUNTER — Encounter (HOSPITAL_COMMUNITY): Payer: Self-pay | Admitting: Obstetrics and Gynecology

## 2021-05-10 ENCOUNTER — Inpatient Hospital Stay (HOSPITAL_COMMUNITY)
Admission: AD | Admit: 2021-05-10 | Discharge: 2021-05-10 | Disposition: A | Discharge status: Home / Self Care | Payer: Medicaid Other | Attending: Obstetrics and Gynecology | Admitting: Obstetrics and Gynecology

## 2021-05-10 DIAGNOSIS — Z3A16 16 weeks gestation of pregnancy: Secondary | ICD-10-CM | POA: Insufficient documentation

## 2021-05-10 DIAGNOSIS — Z20822 Contact with and (suspected) exposure to covid-19: Secondary | ICD-10-CM | POA: Diagnosis not present

## 2021-05-10 DIAGNOSIS — G40909 Epilepsy, unspecified, not intractable, without status epilepticus: Secondary | ICD-10-CM | POA: Insufficient documentation

## 2021-05-10 DIAGNOSIS — Z79899 Other long term (current) drug therapy: Secondary | ICD-10-CM | POA: Diagnosis not present

## 2021-05-10 DIAGNOSIS — J101 Influenza due to other identified influenza virus with other respiratory manifestations: Secondary | ICD-10-CM | POA: Diagnosis not present

## 2021-05-10 DIAGNOSIS — Z87891 Personal history of nicotine dependence: Secondary | ICD-10-CM | POA: Diagnosis not present

## 2021-05-10 DIAGNOSIS — O99352 Diseases of the nervous system complicating pregnancy, second trimester: Secondary | ICD-10-CM | POA: Insufficient documentation

## 2021-05-10 DIAGNOSIS — O99512 Diseases of the respiratory system complicating pregnancy, second trimester: Secondary | ICD-10-CM | POA: Insufficient documentation

## 2021-05-10 LAB — URINALYSIS, ROUTINE W REFLEX MICROSCOPIC
Bilirubin Urine: NEGATIVE
Glucose, UA: NEGATIVE mg/dL
Hgb urine dipstick: NEGATIVE
Ketones, ur: 80 mg/dL — AB
Leukocytes,Ua: NEGATIVE
Nitrite: NEGATIVE
Protein, ur: 30 mg/dL — AB
Specific Gravity, Urine: 1.02 (ref 1.005–1.030)
pH: 5 (ref 5.0–8.0)

## 2021-05-10 LAB — RESP PANEL BY RT-PCR (FLU A&B, COVID) ARPGX2
Influenza A by PCR: POSITIVE — AB
Influenza B by PCR: NEGATIVE
SARS Coronavirus 2 by RT PCR: NEGATIVE

## 2021-05-10 MED ORDER — LACTATED RINGERS IV BOLUS
1000.0000 mL | Freq: Once | INTRAVENOUS | Status: AC
  Administered 2021-05-10: 1000 mL via INTRAVENOUS

## 2021-05-10 MED ORDER — OSELTAMIVIR PHOSPHATE 75 MG PO CAPS: 75.0000 mg | ORAL_CAPSULE | Freq: Two times a day (BID) | ORAL | 0 refills | Status: DC

## 2021-05-10 NOTE — Discharge Instructions (Signed)

## 2021-05-10 NOTE — MAU Provider Note (Signed)
History     CSN: [id number]  Arrival date and time: 05/10/21 1136   Event Date/Time   First Provider Initiated Contact with Patient 05/10/21 1507      Chief Complaint  Patient presents with  . cough  . Chills  . Nausea   Karina Nelson is 30 y.o. G2P1001 at [redacted]w[redacted]d who presents today with flu like symptoms since 05/09/2021. She reports starting yesterday she has had cold, chills, fatigue and congestion. She denies any abodminal pain, vaginal bleeding or LOF. She reports that she has felt some fetal movement.   URI  This is a new problem. The current episode started yesterday. The problem has been unchanged. There has been no fever. Associated symptoms include congestion, coughing, headaches, nausea, rhinorrhea, sneezing and a sore throat. Pertinent negatives include no dysuria. She has tried nothing for the symptoms. The treatment provided no relief.    OB History    Gravida  2   Para  1   Term  1   Preterm      AB      Living  1     SAB      IAB      Ectopic      Multiple  0   Live Births  1           Past Medical History:  Diagnosis Date  . Anemia   . Asthma   . Epilepsy (HCC)   . Seasonal allergies   . SVD (spontaneous vaginal delivery) 05/07/2017    Past Surgical History:  Procedure Laterality Date  . OTHER SURGICAL HISTORY     cyst removal behind left ear    Family History  Problem Relation Age of Onset  . Hypertension Mother   . Cancer Father   . Heart disease Father   . Diabetes Paternal Grandfather   . Cancer Paternal Aunt     Social History   Tobacco Use  . Smoking status: Former Smoker    Types: Cigars  . Smokeless tobacco: Never Used  . Tobacco comment: 1-2 cigars a day  Vaping Use  . Vaping Use: Never used  Substance Use Topics  . Alcohol use: Not Currently    Alcohol/week: 0.0 standard drinks    Comment: social  . Drug use: Not Currently    Types: Marijuana    Allergies:  Allergies  Allergen Reactions  . Food  Anaphylaxis and Other (See Comments)    Pt is allergic to celery and strawberries.    . Rice Anaphylaxis  . Pineapple Itching  . Sudafed [Pseudoephedrine] Itching and Other (See Comments)    Reaction:  Dizziness and weakness   . Banana Itching    Medications Prior to Admission  Medication Sig Dispense Refill Last Dose  . fexofenadine (ALLEGRA) 180 MG tablet Take 1 tablet (180 mg total) by mouth daily. 30 tablet 1 05/09/2021 at Unknown time  . lamoTRIgine (LAMICTAL) 200 MG tablet Take 200 mg by mouth 2 (two) times daily.   05/10/2021 at Unknown time  . cetirizine (ZYRTEC) 10 MG tablet Take 1 tablet (10 mg total) by mouth daily as needed for allergies. 90 tablet 1 More than a month at Unknown time  . montelukast (SINGULAIR) 10 MG tablet Take 1 tablet (10 mg total) by mouth at bedtime. (Patient taking differently: Take 10 mg by mouth as needed (allergies). ) 60 tablet 1   . naproxen (NAPROSYN) 375 MG tablet Take 1 tablet (375 mg total) by mouth 2 (two)  times daily. (Patient not taking: Reported on 06/06/2020) 20 tablet 0   . omeprazole (PRILOSEC) 20 MG capsule Take 1 capsule (20 mg total) by mouth daily. (Patient not taking: Reported on 06/06/2020) 14 capsule 0   . ondansetron (ZOFRAN ODT) 4 MG disintegrating tablet Take 1 tablet (4 mg total) by mouth every 8 (eight) hours as needed for nausea or vomiting. (Patient not taking: Reported on 06/06/2020) 15 tablet 0   . sucralfate (CARAFATE) 1 GM/10ML suspension Take 10 mLs (1 g total) by mouth 4 (four) times daily -  with meals and at bedtime for 5 days. (Patient not taking: Reported on 06/06/2020) 200 mL 0     Review of Systems  Constitutional: Positive for chills and fatigue. Negative for fever.  HENT: Positive for congestion, rhinorrhea, sneezing and sore throat.   Respiratory: Positive for cough.   Gastrointestinal: Positive for nausea.  Genitourinary: Negative for dysuria, pelvic pain, vaginal bleeding and vaginal discharge.  Neurological: Positive  for headaches.   Physical Exam   Blood pressure 113/74, pulse (!) 106, temperature 98.5 F (36.9 C), temperature source Oral, resp. rate 18, height 5\' 5"  (1.651 m), weight 104.3 kg, last menstrual period 01/22/2021, SpO2 100 %.  Physical Exam Vitals and nursing note reviewed.  Constitutional:      General: She is not in acute distress. HENT:     Head: Normocephalic.  Eyes:     Pupils: Pupils are equal, round, and reactive to light.  Cardiovascular:     Rate and Rhythm: Normal rate.  Pulmonary:     Effort: Pulmonary effort is normal.  Abdominal:     Palpations: Abdomen is soft.     Tenderness: There is no abdominal tenderness.  Skin:    General: Skin is warm and dry.  Neurological:     Mental Status: She is alert and oriented to person, place, and time.  Psychiatric:        Mood and Affect: Mood normal.        Behavior: Behavior normal.    Results for orders placed or performed during the hospital encounter of 05/10/21 (from the past 24 hour(s))  Urinalysis, Routine w reflex microscopic     Status: Abnormal   Collection Time: 05/10/21 12:12 PM  Result Value Ref Range   Color, Urine AMBER (A) YELLOW   APPearance CLOUDY (A) CLEAR   Specific Gravity, Urine 1.020 1.005 - 1.030   pH 5.0 5.0 - 8.0   Glucose, UA NEGATIVE NEGATIVE mg/dL   Hgb urine dipstick NEGATIVE NEGATIVE   Bilirubin Urine NEGATIVE NEGATIVE   Ketones, ur 80 (A) NEGATIVE mg/dL   Protein, ur 30 (A) NEGATIVE mg/dL   Nitrite NEGATIVE NEGATIVE   Leukocytes,Ua NEGATIVE NEGATIVE   RBC / HPF 0-5 0 - 5 RBC/hpf   WBC, UA 11-20 0 - 5 WBC/hpf   Bacteria, UA MANY (A) NONE SEEN   Squamous Epithelial / LPF 0-5 0 - 5   Mucus PRESENT   Resp Panel by RT-PCR (Flu A&B, Covid) Nasopharyngeal Swab     Status: Abnormal   Collection Time: 05/10/21  1:12 PM   Specimen: Nasopharyngeal Swab; Nasopharyngeal(NP) swabs in vial transport medium  Result Value Ref Range   SARS Coronavirus 2 by RT PCR NEGATIVE NEGATIVE   Influenza A  by PCR POSITIVE (A) NEGATIVE   Influenza B by PCR NEGATIVE NEGATIVE    MAU Course  Procedures  MDM Patient given 1L of LR  Assessment and Plan   1. Influenza A  2. [redacted] weeks gestation of pregnancy    DC home Comfort measures reviewed  2nd/3rd Trimester precautions  PTL precautions  Fetal kick counts RX: tamiflu BID x 5 days  Return to MAU as needed FU with OB as planned   Follow-up Information    [facility] Follow up.   Contact information: [address]  [address] [place] [address] [phone]              Thressa Sheller DNP, CNM  05/10/21  4:22 PM

## 2021-05-10 NOTE — MAU Note (Addendum)
Pt complains of loss appetite, chills, sweats, nausea, cough, previous headache since yesterday. Denies VB, cramping. +FM. Prenatal care at [facility].  Per pt [redacted]w[redacted]d. Has not taken any medication for this.

## 2021-07-17 ENCOUNTER — Other Ambulatory Visit: Payer: Self-pay

## 2021-07-17 ENCOUNTER — Ambulatory Visit
Admission: EM | Admit: 2021-07-17 | Discharge: 2021-07-17 | Disposition: A | Discharge status: Home / Self Care | Payer: Medicaid Other | Attending: Emergency Medicine | Admitting: Emergency Medicine

## 2021-07-17 DIAGNOSIS — T23261A Burn of second degree of back of right hand, initial encounter: Secondary | ICD-10-CM | POA: Diagnosis not present

## 2021-07-17 MED ORDER — SILVER SULFADIAZINE 1 % EX CREA: 1.0000 "application " | TOPICAL_CREAM | Freq: Every day | CUTANEOUS | 0 refills | Status: DC
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
## 2021-07-17 NOTE — Discharge Instructions (Addendum)
Please use Tylenol [phone] mg every 4-6 hours May apply aloe vera for further relief/healing May apply wet gauze to keep cool Do not apply ice Silvadene daily Follow up for any concerns

## 2021-08-10 ENCOUNTER — Other Ambulatory Visit: Payer: Self-pay

## 2021-08-10 ENCOUNTER — Encounter: Payer: Self-pay | Admitting: Emergency Medicine

## 2021-08-10 ENCOUNTER — Ambulatory Visit
Admission: EM | Admit: 2021-08-10 | Discharge: 2021-08-10 | Disposition: A | Discharge status: Home / Self Care | Payer: Medicaid Other | Attending: Urgent Care | Admitting: Urgent Care

## 2021-08-10 DIAGNOSIS — Z20822 Contact with and (suspected) exposure to covid-19: Secondary | ICD-10-CM

## 2021-08-10 NOTE — ED Triage Notes (Signed)
A child tested positive for covid at her child's school, they told the patient that herself and her daughter needed to be tested and cleared for covid. Patient denies signs or symptoms of covid, just needs test

## 2021-08-11 LAB — NOVEL CORONAVIRUS, NAA: SARS-CoV-2, NAA: NOT DETECTED

## 2021-08-11 LAB — SARS-COV-2, NAA 2 DAY TAT

## 2021-08-30 ENCOUNTER — Encounter (HOSPITAL_COMMUNITY): Payer: Self-pay | Admitting: *Deleted

## 2021-08-30 ENCOUNTER — Inpatient Hospital Stay (HOSPITAL_COMMUNITY)
Admission: AD | Admit: 2021-08-30 | Discharge: 2021-08-30 | Disposition: A | Discharge status: Home / Self Care | Payer: Medicaid Other | Attending: Obstetrics and Gynecology | Admitting: Obstetrics and Gynecology

## 2021-08-30 DIAGNOSIS — O26893 Other specified pregnancy related conditions, third trimester: Secondary | ICD-10-CM | POA: Diagnosis present

## 2021-08-30 DIAGNOSIS — B3731 Acute candidiasis of vulva and vagina: Secondary | ICD-10-CM

## 2021-08-30 DIAGNOSIS — Z3A32 32 weeks gestation of pregnancy: Secondary | ICD-10-CM

## 2021-08-30 DIAGNOSIS — Z3689 Encounter for other specified antenatal screening: Secondary | ICD-10-CM

## 2021-08-30 DIAGNOSIS — Z87891 Personal history of nicotine dependence: Secondary | ICD-10-CM | POA: Insufficient documentation

## 2021-08-30 DIAGNOSIS — O26899 Other specified pregnancy related conditions, unspecified trimester: Secondary | ICD-10-CM

## 2021-08-30 DIAGNOSIS — R102 Pelvic and perineal pain: Secondary | ICD-10-CM

## 2021-08-30 DIAGNOSIS — B373 Candidiasis of vulva and vagina: Secondary | ICD-10-CM

## 2021-08-30 DIAGNOSIS — Z79899 Other long term (current) drug therapy: Secondary | ICD-10-CM | POA: Diagnosis not present

## 2021-08-30 LAB — URINALYSIS, ROUTINE W REFLEX MICROSCOPIC
Bilirubin Urine: NEGATIVE
Glucose, UA: NEGATIVE mg/dL
Hgb urine dipstick: NEGATIVE
Ketones, ur: 5 mg/dL — AB
Leukocytes,Ua: NEGATIVE
Nitrite: NEGATIVE
Protein, ur: NEGATIVE mg/dL
Specific Gravity, Urine: 1.028 (ref 1.005–1.030)
pH: 5 (ref 5.0–8.0)

## 2021-08-30 LAB — WET PREP, GENITAL
Clue Cells Wet Prep HPF POC: NONE SEEN
Sperm: NONE SEEN
Trich, Wet Prep: NONE SEEN

## 2021-08-30 MED ORDER — TERCONAZOLE 0.4 % VA CREA: 1.0000 | TOPICAL_CREAM | Freq: Every day | VAGINAL | 0 refills | Status: DC

## 2021-08-30 NOTE — MAU Provider Note (Addendum)
History     CSN: [id number]  Arrival date and time: 08/30/21 1854   Event Date/Time   First Provider Initiated Contact with Patient 08/30/21 2014      Chief Complaint  Patient presents with   pelvic pressure   Karina Nelson is a 30 y.o. G2P1001 at [redacted]w[redacted]d who receives care at [facility].  She presents today for pelvic pressure.  She states she has been having pelvic pressure for "awhile," but feels it has worsened in the last couple of nights.  She states she has tried position change and massage without relief.  She endorses fetal movement and denies abdominal cramping.  She reports "a tightening sensation" and is unsure of whether it is contractions or not.  Patient rates the pain a 8/10.    OB History     Gravida  2   Para  1   Term  1   Preterm      AB      Living  1      SAB      IAB      Ectopic      Multiple  0   Live Births  1           Past Medical History:  Diagnosis Date   Anemia    Asthma    Epilepsy (HCC)    Seasonal allergies    SVD (spontaneous vaginal delivery) 05/07/2017    Past Surgical History:  Procedure Laterality Date   OTHER SURGICAL HISTORY     cyst removal behind left ear    Family History  Problem Relation Age of Onset   Hypertension Mother    Cancer Father    Heart disease Father    Diabetes Paternal Grandfather    Cancer Paternal Aunt     Social History   Tobacco Use   Smoking status: Former    Types: Cigars   Smokeless tobacco: Never   Tobacco comments:    1-2 cigars a day  Vaping Use   Vaping Use: Never used  Substance Use Topics   Alcohol use: Not Currently    Alcohol/week: 0.0 standard drinks    Comment: social   Drug use: Not Currently    Types: Marijuana    Allergies:  Allergies  Allergen Reactions   Food Anaphylaxis and Other (See Comments)    Pt is allergic to celery and strawberries.     Rice Anaphylaxis   Pineapple Itching   Sudafed [Pseudoephedrine] Itching and Other (See  Comments)    Reaction:  Dizziness and weakness    Banana Itching    Medications Prior to Admission  Medication Sig Dispense Refill Last Dose   ferrous sulfate 325 (65 FE) MG EC tablet Take 325 mg by mouth daily.      lamoTRIgine (LAMICTAL) 200 MG tablet Take 400 mg by mouth 2 (two) times daily.   08/30/2021   Prenatal Vit-Fe Fumarate-FA (PRENATAL MULTIVITAMIN) TABS tablet Take 1 tablet by mouth daily at 12 noon.      cetirizine (ZYRTEC) 10 MG tablet Take 1 tablet (10 mg total) by mouth daily as needed for allergies. 90 tablet 1 More than a month   fexofenadine (ALLEGRA) 180 MG tablet Take 1 tablet (180 mg total) by mouth daily. 30 tablet 1 More than a month   montelukast (SINGULAIR) 10 MG tablet Take 1 tablet (10 mg total) by mouth at bedtime. (Patient taking differently: Take 10 mg by mouth as needed (allergies).) 60 tablet 1  silver sulfADIAZINE (SILVADENE) 1 % cream Apply 1 application topically daily. 50 g 0     Review of Systems  Constitutional:  Negative for chills and fever.  Respiratory:  Negative for cough and shortness of breath.   Gastrointestinal:  Positive for abdominal pain (Tightening). Negative for constipation, diarrhea, nausea and vomiting.  Genitourinary:  Positive for pelvic pain. Negative for difficulty urinating, dysuria, vaginal bleeding and vaginal discharge.  Musculoskeletal:  Positive for back pain.  Neurological:  Negative for dizziness, light-headedness and headaches.  Physical Exam   Blood pressure 121/69, pulse 96, temperature (!) 97.4 F (36.3 C), resp. rate 16, height 5\' 5"  (1.651 m), weight 119.3 kg, last menstrual period 01/22/2021, SpO2 100 %.  Physical Exam Vitals reviewed. Exam conducted with a chaperone present.  Constitutional:      Appearance: Normal appearance.  HENT:     Head: Normocephalic and atraumatic.  Eyes:     Conjunctiva/sclera: Conjunctivae normal.  Pulmonary:     Effort: Pulmonary effort is normal. No respiratory distress.   Abdominal:     Tenderness: There is abdominal tenderness.     Comments: Tenderness in groin area bilaterally.   Genitourinary:    Comments: Speculum Exam: -Normal External Genitalia: Non tender, no apparent discharge at introitus.  -Vaginal Vault: Pink mucosa with good rugae. White plaques noted on vaginal walls -wet prep collected -Cervix:Pink, no lesions, cysts, or polyps. White plaques noted on external surface. Appears closed. No active bleeding from os-GC/CT collected -Bimanual Exam: Dilation: Closed (Ext os 1cm and internal os closed) Exam by:: [email or web address] CNM   Musculoskeletal:        General: Normal range of motion.  Skin:    General: Skin is warm and dry.  Neurological:     Mental Status: She is alert.  Psychiatric:        Mood and Affect: Mood normal.        Behavior: Behavior normal.        Thought Content: Thought content normal.    Fetal Assessment 135 bpm, Mod Var, -Decels, +Accels Toco: 2 Contractions graphed  MAU Course   Results for orders placed or performed during the hospital encounter of 08/30/21 (from the past 24 hour(s))  Urinalysis, Routine w reflex microscopic Urine, Clean Catch     Status: Abnormal   Collection Time: 08/30/21  7:13 PM  Result Value Ref Range   Color, Urine YELLOW YELLOW   APPearance HAZY (A) CLEAR   Specific Gravity, Urine 1.028 1.005 - 1.030   pH 5.0 5.0 - 8.0   Glucose, UA NEGATIVE NEGATIVE mg/dL   Hgb urine dipstick NEGATIVE NEGATIVE   Bilirubin Urine NEGATIVE NEGATIVE   Ketones, ur 5 (A) NEGATIVE mg/dL   Protein, ur NEGATIVE NEGATIVE mg/dL   Nitrite NEGATIVE NEGATIVE   Leukocytes,Ua NEGATIVE NEGATIVE  Wet prep, genital     Status: Abnormal   Collection Time: 08/30/21  8:49 PM   Specimen: Vaginal  Result Value Ref Range   Yeast Wet Prep HPF POC PRESENT (A) NONE SEEN   Trich, Wet Prep NONE SEEN NONE SEEN   Clue Cells Wet Prep HPF POC NONE SEEN NONE SEEN   WBC, Wet Prep HPF POC MANY (A) NONE SEEN   Sperm NONE SEEN     No results found.  MDM PE Labs: Wet Prep, GC/CT EFM Prescription Assessment and Plan  30 year old G2P1001  SIUP at 32.5weeks Cat I FT Pelvic Pain  -POC Reviewed -Exam performed and findings discussed. -Cultures collected and  pending.  -Informed that findings are c/w yeast infection. -Discussed treatment with Terazol 7. -Educated on how yeast can increase perception of pelvic/vaginal pain/pressure. -Patient offered and declined pain medication. -NST Reactive -Will await results, but will plan to send home if no other findings identified.   Cherre Robins MSN, CNM 08/30/2021, 8:14 PM    Reassessment (9:23 PM)  -Results c/w yeast. -Prescription sent to pharmacy on file.  -NST Remains reactive. -Patient to follow up with primary provider as scheduled. -Encouraged to call or return to MAU if symptoms worsen or with the onset of new symptoms. -Discharged to home in stable condition.   Cherre Robins MSN, CNM Advanced Practice Provider, [facility] [employer]

## 2021-08-30 NOTE — MAU Note (Addendum)
I've had a lot of pelvic pressure for awhile and I pee often but do not go much. Having a lot of pelvic and abdominal pressure. Sometimes have SOB but assume that goes with pregnancy. Denies dysuria. Denies LOF or VB. Some mucousy d/c.

## 2021-08-30 NOTE — Progress Notes (Signed)
Pt d/ced by Karl Ito RN

## 2021-08-31 LAB — GC/CHLAMYDIA PROBE AMP (~~LOC~~) NOT AT ARMC
Chlamydia: NEGATIVE
Comment: NEGATIVE
Comment: NORMAL
Neisseria Gonorrhea: NEGATIVE

## 2021-09-26 LAB — OB RESULTS CONSOLE GBS: GBS: POSITIVE

## 2021-10-11 ENCOUNTER — Encounter (HOSPITAL_COMMUNITY): Payer: Self-pay | Admitting: Obstetrics and Gynecology

## 2021-10-13 ENCOUNTER — Other Ambulatory Visit: Payer: Self-pay | Admitting: Obstetrics and Gynecology

## 2021-10-14 ENCOUNTER — Inpatient Hospital Stay (HOSPITAL_COMMUNITY): Payer: Medicaid Other

## 2021-10-15 ENCOUNTER — Other Ambulatory Visit: Payer: Self-pay

## 2021-10-15 ENCOUNTER — Inpatient Hospital Stay (HOSPITAL_COMMUNITY)
Admission: AD | Admit: 2021-10-15 | Discharge: 2021-10-17 | DRG: 807 | Disposition: A | Discharge status: Home / Self Care | Payer: Medicaid Other | Attending: Obstetrics and Gynecology | Admitting: Obstetrics and Gynecology

## 2021-10-15 ENCOUNTER — Encounter (HOSPITAL_COMMUNITY): Payer: Self-pay | Admitting: Obstetrics and Gynecology

## 2021-10-15 DIAGNOSIS — O99354 Diseases of the nervous system complicating childbirth: Secondary | ICD-10-CM | POA: Diagnosis present

## 2021-10-15 DIAGNOSIS — Z20822 Contact with and (suspected) exposure to covid-19: Secondary | ICD-10-CM | POA: Diagnosis present

## 2021-10-15 DIAGNOSIS — G40909 Epilepsy, unspecified, not intractable, without status epilepticus: Secondary | ICD-10-CM | POA: Diagnosis present

## 2021-10-15 DIAGNOSIS — Z87891 Personal history of nicotine dependence: Secondary | ICD-10-CM

## 2021-10-15 DIAGNOSIS — Z3A39 39 weeks gestation of pregnancy: Secondary | ICD-10-CM

## 2021-10-15 DIAGNOSIS — O99824 Streptococcus B carrier state complicating childbirth: Principal | ICD-10-CM | POA: Diagnosis present

## 2021-10-15 DIAGNOSIS — O26893 Other specified pregnancy related conditions, third trimester: Secondary | ICD-10-CM | POA: Diagnosis present

## 2021-10-15 DIAGNOSIS — O9902 Anemia complicating childbirth: Secondary | ICD-10-CM | POA: Diagnosis present

## 2021-10-15 HISTORY — DX: Headache, unspecified: R51.9

## 2021-10-15 HISTORY — DX: Depression, unspecified: F32.A

## 2021-10-15 HISTORY — DX: Scoliosis, unspecified: M41.9

## 2021-10-15 HISTORY — DX: Anhedonia: R45.84

## 2021-10-15 LAB — CBC
HCT: 34.8 % — ABNORMAL LOW (ref 36.0–46.0)
Hemoglobin: 11.6 g/dL — ABNORMAL LOW (ref 12.0–15.0)
MCH: 28.4 pg (ref 26.0–34.0)
MCHC: 33.3 g/dL (ref 30.0–36.0)
MCV: 85.3 fL (ref 80.0–100.0)
Platelets: 142 10*3/uL — ABNORMAL LOW (ref 150–400)
RBC: 4.08 MIL/uL (ref 3.87–5.11)
RDW: 17 % — ABNORMAL HIGH (ref 11.5–15.5)
WBC: 6.8 10*3/uL (ref 4.0–10.5)
nRBC: 0 % (ref 0.0–0.2)

## 2021-10-15 LAB — RESP PANEL BY RT-PCR (FLU A&B, COVID) ARPGX2
Influenza A by PCR: NEGATIVE
Influenza B by PCR: NEGATIVE
SARS Coronavirus 2 by RT PCR: NEGATIVE

## 2021-10-15 LAB — TYPE AND SCREEN
ABO/RH(D): A POS
Antibody Screen: NEGATIVE

## 2021-10-15 MED ORDER — LACTATED RINGERS IV SOLN: INTRAVENOUS | Status: DC

## 2021-10-15 MED ORDER — LACTATED RINGERS IV SOLN: 500.0000 mL | INTRAVENOUS | Status: DC | PRN

## 2021-10-15 MED ORDER — TERBUTALINE SULFATE 1 MG/ML IJ SOLN: 0.2500 mg | Freq: Once | INTRAMUSCULAR | Status: DC | PRN

## 2021-10-15 MED ORDER — OXYCODONE-ACETAMINOPHEN 5-325 MG PO TABS: 2.0000 | ORAL_TABLET | ORAL | Status: DC | PRN

## 2021-10-15 MED ORDER — BUTORPHANOL TARTRATE 1 MG/ML IJ SOLN: 1.0000 mg | INTRAMUSCULAR | Status: DC | PRN

## 2021-10-15 MED ORDER — OXYTOCIN BOLUS FROM INFUSION: 333.0000 mL | Freq: Once | INTRAVENOUS | Status: DC

## 2021-10-15 MED ORDER — SOD CITRATE-CITRIC ACID 500-334 MG/5ML PO SOLN: 30.0000 mL | ORAL | Status: DC | PRN

## 2021-10-15 MED ORDER — ONDANSETRON HCL 4 MG/2ML IJ SOLN: 4.0000 mg | Freq: Four times a day (QID) | INTRAMUSCULAR | Status: DC | PRN

## 2021-10-15 MED ORDER — PENICILLIN G POT IN DEXTROSE 60000 UNIT/ML IV SOLN
3.0000 10*6.[IU] | INTRAVENOUS | Status: DC
  Administered 2021-10-15 – 2021-10-16 (×2): 3 10*6.[IU] via INTRAVENOUS
  Filled 2021-10-15 (×2): qty 50

## 2021-10-15 MED ORDER — SODIUM CHLORIDE 0.9 % IV SOLN
5.0000 10*6.[IU] | Freq: Once | INTRAVENOUS | Status: AC
  Administered 2021-10-15: 5 10*6.[IU] via INTRAVENOUS
  Filled 2021-10-15: qty 5

## 2021-10-15 MED ORDER — OXYCODONE-ACETAMINOPHEN 5-325 MG PO TABS: 1.0000 | ORAL_TABLET | ORAL | Status: DC | PRN

## 2021-10-15 MED ORDER — FENTANYL CITRATE (PF) 100 MCG/2ML IJ SOLN
100.0000 ug | Freq: Once | INTRAMUSCULAR | Status: AC
  Administered 2021-10-15: 100 ug via INTRAVENOUS
  Filled 2021-10-15: qty 2

## 2021-10-15 MED ORDER — OXYTOCIN-SODIUM CHLORIDE 30-0.9 UT/500ML-% IV SOLN: 2.5000 [IU]/h | INTRAVENOUS | Status: DC

## 2021-10-15 MED ORDER — ACETAMINOPHEN 325 MG PO TABS: 650.0000 mg | ORAL_TABLET | ORAL | Status: DC | PRN

## 2021-10-15 MED ORDER — OXYTOCIN-SODIUM CHLORIDE 30-0.9 UT/500ML-% IV SOLN
1.0000 m[IU]/min | INTRAVENOUS | Status: DC
  Administered 2021-10-15: 2 m[IU]/min via INTRAVENOUS
  Filled 2021-10-15: qty 500

## 2021-10-15 MED ORDER — LAMOTRIGINE 150 MG PO TABS
400.0000 mg | ORAL_TABLET | Freq: Two times a day (BID) | ORAL | Status: DC
  Administered 2021-10-15: 400 mg via ORAL
  Filled 2021-10-15 (×2): qty 1

## 2021-10-15 MED ORDER — LIDOCAINE HCL (PF) 1 % IJ SOLN: 30.0000 mL | INTRAMUSCULAR | Status: DC | PRN

## 2021-10-15 NOTE — H&P (Signed)
Karina Nelson is a 30 y.o. female presenting for scheduled IOL. +FM, denies VB, LOF, CTX.  PNC cb/ 1) GBS pos 2) Anhedonia - improving on therapy 3) Mild anemia on PO iron 4) scoliosis - declining epidural, did not have with first induction 5) Epilepsy in mother - on lamictal 400mg  BID, follows Neuro at [facility]  OB History     Gravida  2   Para  1   Term  1   Preterm      AB      Living  1      SAB      IAB      Ectopic      Multiple  0   Live Births  1          Past Medical History:  Diagnosis Date   Anemia    Anhedonia    Asthma    Depression    Epilepsy (HCC)    migraines without aura    Scoliosis    Seasonal allergies    SVD (spontaneous vaginal delivery) 05/07/2017   Past Surgical History:  Procedure Laterality Date   OTHER SURGICAL HISTORY     cyst removal behind left ear   Family History: family history includes Cancer in her father and paternal aunt; Diabetes in her paternal grandfather; Heart disease in her father; Hypertension in her mother. Social History:  reports that she has quit smoking. Her smoking use included cigars. She has never used smokeless tobacco. She reports that she does not currently use alcohol. She reports that she does not currently use drugs after having used the following drugs: Marijuana.     Maternal Diabetes: No1hr 107 Genetic Screening: Normal Maternal Ultrasounds/Referrals: Normal Fetal Ultrasounds or other Referrals:  None Maternal Substance Abuse:  No Significant Maternal Medications:  Meds include: Other: Lamictal Significant Maternal Lab Results:  Group B Strep positive Other Comments:  None  Review of Systems  Constitutional:  Negative for chills and fever.  Respiratory:  Negative for shortness of breath.   Cardiovascular:  Negative for chest pain, palpitations and leg swelling.  Gastrointestinal:  Negative for abdominal pain, nausea and vomiting.  Neurological:  Negative for dizziness, weakness  and headaches.  Psychiatric/Behavioral:  Negative for suicidal ideas.   Maternal Medical History:  Reason for admission: Nausea.  Fetal activity: Perceived fetal activity is normal.   Prenatal complications: No PIH.   Prenatal Complications - Diabetes: none.  Dilation: 4 Effacement (%): 50 Station: -3 Exam by:: [email or web address] RN Blood pressure 97/62, pulse (!) 103, resp. rate 18, height 5\' 5"  (1.651 m), weight 120.3 kg, last menstrual period 01/22/2021. Exam Physical Exam Constitutional:      General: She is not in acute distress.    Appearance: She is well-developed.  HENT:     Head: Normocephalic and atraumatic.  Eyes:     Pupils: Pupils are equal, round, and reactive to light.  Cardiovascular:     Rate and Rhythm: Normal rate and regular rhythm.     Heart sounds: No murmur heard.   No gallop.  Abdominal:     Tenderness: There is no abdominal tenderness. There is no guarding or rebound.  Genitourinary:    Vagina: Normal.  Musculoskeletal:        General: Normal range of motion.     Cervical back: Normal range of motion and neck supple.  Skin:    General: Skin is warm and dry.  Neurological:     Mental Status:  She is alert and oriented to person, place, and time.    Prenatal labs: ABO, Rh: --/--/PENDING (10/16 1446) Antibody: PENDING (10/16 1446) Rubella: Immune (04/01 0000) RPR: Nonreactive (04/01 0000)  HBsAg: Negative (04/01 0000)  HIV: Non-reactive (04/01 0000)  GBS: Positive/-- (09/27 0000)   Assessment/Plan: This is a 30yo G2P1001 @ 39 2/7 by TVUS NTO c/w LMP admitted for IOL for favorable cervix at term. Pitocin per protocol, GBS pos, will AROM when able. Anticipate SVD, pelvis proven to 6lb14oz  Karina Nelson 10/15/2021, 4:13 PM

## 2021-10-15 NOTE — Progress Notes (Signed)
Feeling more painful contractions BP 105/60   Pulse 88   Temp 97.9 F (36.6 C) (Oral)   Resp 18   Ht 5\' 5"  (1.651 m)   Wt 120.3 kg   LMP 01/22/2021   BMI 44.13 kg/m  CE tight 5/90/-1, pitocin at 14 mU/min, TOCO q69m Category 1 tracing  Continue to titrate pitocin

## 2021-10-15 NOTE — Progress Notes (Signed)
Occ cramping, +FM  BP 111/67   Pulse 82   Temp 98 F (36.7 C) (Oral)   Resp 18   Ht 5\' 5"  (1.651 m)   Wt 120.3 kg   LMP 01/22/2021   BMI 44.13 kg/m  Clear AROM, copious fluid. CE 4/70/-3, pitocin at 10 mU/min TOCO q4-61m  Continue to titrate per protocol, anticipate SVD

## 2021-10-16 ENCOUNTER — Encounter (HOSPITAL_COMMUNITY): Payer: Self-pay | Admitting: Obstetrics and Gynecology

## 2021-10-16 LAB — CBC
HCT: 33.5 % — ABNORMAL LOW (ref 36.0–46.0)
Hemoglobin: 11.5 g/dL — ABNORMAL LOW (ref 12.0–15.0)
MCH: 29 pg (ref 26.0–34.0)
MCHC: 34.3 g/dL (ref 30.0–36.0)
MCV: 84.4 fL (ref 80.0–100.0)
Platelets: 133 10*3/uL — ABNORMAL LOW (ref 150–400)
RBC: 3.97 MIL/uL (ref 3.87–5.11)
RDW: 17.1 % — ABNORMAL HIGH (ref 11.5–15.5)
WBC: 13.7 10*3/uL — ABNORMAL HIGH (ref 4.0–10.5)
nRBC: 0 % (ref 0.0–0.2)

## 2021-10-16 LAB — RPR: RPR Ser Ql: NONREACTIVE

## 2021-10-16 MED ORDER — ACETAMINOPHEN 325 MG PO TABS: 650.0000 mg | ORAL_TABLET | ORAL | Status: DC | PRN

## 2021-10-16 MED ORDER — BENZOCAINE-MENTHOL 20-0.5 % EX AERO
1.0000 "application " | INHALATION_SPRAY | CUTANEOUS | Status: DC | PRN
  Administered 2021-10-16 – 2021-10-17 (×2): 1 via TOPICAL
  Filled 2021-10-16 (×2): qty 56

## 2021-10-16 MED ORDER — SILVER NITRATE-POT NITRATE 75-25 % EX MISC
CUTANEOUS | Status: AC
  Filled 2021-10-16: qty 10

## 2021-10-16 MED ORDER — ONDANSETRON HCL 4 MG/2ML IJ SOLN: 4.0000 mg | INTRAMUSCULAR | Status: DC | PRN

## 2021-10-16 MED ORDER — SENNOSIDES-DOCUSATE SODIUM 8.6-50 MG PO TABS
2.0000 | ORAL_TABLET | Freq: Every day | ORAL | Status: DC
  Filled 2021-10-16: qty 2

## 2021-10-16 MED ORDER — WITCH HAZEL-GLYCERIN EX PADS: 1.0000 "application " | MEDICATED_PAD | CUTANEOUS | Status: DC | PRN

## 2021-10-16 MED ORDER — COCONUT OIL OIL: 1.0000 "application " | TOPICAL_OIL | Status: DC | PRN

## 2021-10-16 MED ORDER — SIMETHICONE 80 MG PO CHEW: 80.0000 mg | CHEWABLE_TABLET | ORAL | Status: DC | PRN

## 2021-10-16 MED ORDER — DIBUCAINE (PERIANAL) 1 % EX OINT: 1.0000 "application " | TOPICAL_OINTMENT | CUTANEOUS | Status: DC | PRN

## 2021-10-16 MED ORDER — DIPHENHYDRAMINE HCL 50 MG/ML IJ SOLN
50.0000 mg | Freq: Once | INTRAMUSCULAR | Status: AC
  Administered 2021-10-16: 50 mg via INTRAVENOUS

## 2021-10-16 MED ORDER — ZOLPIDEM TARTRATE 5 MG PO TABS: 5.0000 mg | ORAL_TABLET | Freq: Every evening | ORAL | Status: DC | PRN

## 2021-10-16 MED ORDER — ONDANSETRON HCL 4 MG PO TABS: 4.0000 mg | ORAL_TABLET | ORAL | Status: DC | PRN

## 2021-10-16 MED ORDER — DIPHENHYDRAMINE HCL 25 MG PO CAPS: 25.0000 mg | ORAL_CAPSULE | Freq: Four times a day (QID) | ORAL | Status: DC | PRN

## 2021-10-16 MED ORDER — IBUPROFEN 600 MG PO TABS
600.0000 mg | ORAL_TABLET | Freq: Four times a day (QID) | ORAL | Status: DC
  Administered 2021-10-16 – 2021-10-17 (×6): 600 mg via ORAL
  Filled 2021-10-16 (×6): qty 1

## 2021-10-16 MED ORDER — LAMOTRIGINE 150 MG PO TABS
400.0000 mg | ORAL_TABLET | Freq: Two times a day (BID) | ORAL | Status: DC
  Administered 2021-10-16 – 2021-10-17 (×3): 400 mg via ORAL
  Filled 2021-10-16 (×4): qty 1

## 2021-10-16 MED ORDER — TETANUS-DIPHTH-ACELL PERTUSSIS 5-2.5-18.5 LF-MCG/0.5 IM SUSY: 0.5000 mL | PREFILLED_SYRINGE | Freq: Once | INTRAMUSCULAR | Status: DC

## 2021-10-16 MED ORDER — PRENATAL MULTIVITAMIN CH
1.0000 | ORAL_TABLET | Freq: Every day | ORAL | Status: DC
  Administered 2021-10-16 – 2021-10-17 (×2): 1 via ORAL
  Filled 2021-10-16 (×2): qty 1

## 2021-10-16 MED ORDER — DIPHENHYDRAMINE HCL 50 MG/ML IJ SOLN
INTRAMUSCULAR | Status: AC
  Filled 2021-10-16: qty 1

## 2021-10-16 NOTE — Lactation Note (Signed)
This note was copied from a baby's chart. Lactation Consultation Note  Patient Name: [name] Today's Date: 10/16/2021 Reason for consult: Initial assessment;Term Age:31 hours   P2 mother whose infant is now 73 hours old.  This is a term baby at 39+3 weeks.  Mother breast fed her first child for one month.  Her current feeding preference is breast/donor milk although her initial feeding preference was breast/formula.  Baby was swaddled and asleep in mother's arms when I arrived.  Reviewed breast feeding basics with mother.  She is familiar with hand expression; encouraged continued practice and feeding back any EBM she obtains to baby.  Mother already provided donor milk right after delivery due to being tired and not wanting to try latching.  Suggested she focus on breast feeding today and call her RN/LC for latch assistance if needed.  Emphasized that donor milk is available if needed, however, latching early and frequently will help ensure a good milk supply.  Mother verbalized understanding.  Mother will feed 8-12 times/24 hours or sooner if baby shows cues.  She will call for assistance as desired.    Mom made aware of O/P services, breastfeeding support groups, community resources, and our phone # for post-discharge questions.  Mother has a DEBP for home use.  Father and visitors in room at the end of my visit.  RN updated.   Maternal Data Has patient been taught Hand Expression?: Yes Does the patient have breastfeeding experience prior to this delivery?: Yes How long did the patient breastfeed?: 1 month  Feeding Mother's Current Feeding Choice: Breast Milk and Donor Milk  LATCH Score                    Lactation Tools Discussed/Used    Interventions Interventions: Breast feeding basics reviewed;Education  Discharge Pump: Personal  Consult Status Consult Status: Follow-up Date: 10/17/21 Follow-up type: In-patient    Jaice Lague R Yussef Jorge 10/16/2021, 11:21  AM

## 2021-10-16 NOTE — Progress Notes (Signed)
Patient feeling more pressure. CE 8/90/0 station. Encouraged position changedm anticipate SVD BP 105/60   Pulse 88   Temp 97.9 F (36.6 C) (Oral)   Resp 18   Ht 5\' 5"  (1.651 m)   Wt 120.3 kg   LMP 01/22/2021   BMI 44.13 kg/m

## 2021-10-16 NOTE — Lactation Note (Signed)
This note was copied from a baby's chart. Lactation Consultation Note  Patient Name: Karina Nelson Today's Date: 10/16/2021 Reason for consult: L&D Initial assessment;Term Age:31 hours P2, female infant was cuing to breastfeeding when [facility] entered the room, mom was doing skin to skin with infant. Mom latched infant on her right breast using the football hold position, infant sustained latch with depth and breastfeed for 10 minutes, afterwards mom did hand expression and infant was given 3 mls of colostrum by spoon.  Infant was still cuing to breastfeed, mom re-latched infant on her right breast and infant was still breastfeeding when [facility] left the room. Mom knows to breastfeed infant according to cues, 8 to 12+ or more times within 24 hours, skin to skin. Mom knows to call RN/LC on MBU for latch assistance if needed.  LC discussed the importance of maternal rest, balance maternal diet and drink water to thirst while breastfeeding.  Maternal Data Has patient been taught Hand Expression?: Yes Does the patient have breastfeeding experience prior to this delivery?: Yes How long did the patient breastfeed?: Per mom, she bF her 99 year old daughter for 1 month, stopped due to low milk supply.  Feeding Mother's Current Feeding Choice: Breast Milk and Formula  LATCH Score Latch: Grasps breast easily, tongue down, lips flanged, rhythmical sucking.  Audible Swallowing: Spontaneous and intermittent  Type of Nipple: Everted at rest and after stimulation  Comfort (Breast/Nipple): Soft / non-tender  Hold (Positioning): Assistance needed to correctly position infant at breast and maintain latch.  LATCH Score: 9   Lactation Tools Discussed/Used    Interventions Interventions: Assisted with latch;Skin to skin;Hand express;Breast compression;Adjust position;Support pillows;Position options;Expressed milk;Education  Discharge    Consult Status Consult Status: Follow-up from L&D Date:  10/16/21 Follow-up type: In-patient    Danelle Earthly 10/16/2021, 3:38 AM

## 2021-10-16 NOTE — Progress Notes (Signed)
PPD #0 No problems Afeb, VSS Fundus firm, NT at U-1 Continue routine postpartum care 

## 2021-10-17 NOTE — Lactation Note (Signed)
This note was copied from a baby's chart. Lactation Consultation Note  Patient Name: Karina Nelson Today's Date: 10/17/2021 Reason for consult: Follow-up assessment;Term;Infant weight loss;Other (Comment) (5 % weight loss / per mom baby recently fed 10 mins at 1155 with swallows/ LC reviewed the doc flow sheets w/ mom and added one more stool,making it 3 in life. mom aware to call with feeding cues for Latch assessment.) Age:31 hours For possible D/C this afternoon / LC reviewed BF D/C teaching.  Maternal Data    Feeding Mother's Current Feeding Choice: Breast Milk and Donor Milk  LATCH Score ( Latch score by the [facility] [place] )  Latch: Repeated attempts needed to sustain latch, nipple held in mouth throughout feeding, stimulation needed to elicit sucking reflex.  Audible Swallowing: A few with stimulation  Type of Nipple: Everted at rest and after stimulation  Comfort (Breast/Nipple): Soft / non-tender  Hold (Positioning): Assistance needed to correctly position infant at breast and maintain latch.  LATCH Score: 7   Lactation Tools Discussed/Used Tools: Pump Breast pump type: Manual  Interventions Interventions: Breast feeding basics reviewed;Education;Hand express  Discharge Discharge Education: Engorgement and breast care Pump: Personal;DEBP  Consult Status Consult Status: Follow-up Date: 10/17/21 Follow-up type: In-patient    Matilde Sprang Airam Runions 10/17/2021, 12:42 PM

## 2021-10-17 NOTE — Discharge Summary (Signed)
Postpartum Discharge Summary  Date of Service updated      Patient Name: Karina Nelson DOB: 13-May-1991 MRN: [id number]  Date of admission: 10/15/2021 Delivery date:10/16/2021  Delivering provider: Deliah Boston  Date of discharge: 10/17/2021  Admitting diagnosis: [redacted] weeks gestation of pregnancy [Z3A.39] Intrauterine pregnancy: [redacted]w[redacted]d    Secondary diagnosis:  Active Problems:   [redacted] weeks gestation of pregnancy  Additional problems: epilepsy    Discharge diagnosis: Term Pregnancy Delivered                                              Post partum procedures: none Augmentation: AROM and Pitocin Complications: None  Hospital course: Induction of Labor With Vaginal Delivery   30y.o. yo [id number] admitted to the hospital 10/15/2021 for induction of labor.  Indication for induction: Elective.  Patient had an uncomplicated labor course as follows: Membrane Rupture Time/Date: 6:43 PM ,10/15/2021   Delivery Method:Vaginal, Spontaneous  Episiotomy: None  Lacerations:    Details of delivery can be found in separate delivery note.  Patient had a routine postpartum course. Patient is discharged home 10/17/21.  Newborn Data: Birth date:10/16/2021  Birth time:2:25 AM  Gender:Female  Living status:Living  Apgars:9 ,9  Weight:3459 g   Magnesium Sulfate received: No BMZ received: No Rhophylac:No MMR:No T-DaP:Given prenatally Flu: No Transfusion:No  Physical exam  Vitals:   10/16/21 1656 10/16/21 1736 10/16/21 1942 10/17/21 0534  BP: 104/87 100/73 109/71 96/63  Pulse: 85 84 89 84  Resp: 16   19  Temp: 98.3 F (36.8 C)  98 F (36.7 C) 98 F (36.7 C)  TempSrc: Oral  Oral Oral  SpO2: 99%  100% 98%  Weight:      Height:        Labs: Lab Results  Component Value Date   WBC 13.7 (H) 10/16/2021   HGB 11.5 (L) 10/16/2021   HCT 33.5 (L) 10/16/2021   MCV 84.4 10/16/2021   PLT 133 (L) 10/16/2021   CMP Latest Ref Rng & Units 06/06/2020  Glucose 70 - 99 mg/dL  96  BUN 6 - 20 mg/dL 6  Creatinine 0.44 - 1.00 mg/dL 0.71  Sodium 135 - 145 mmol/L 138  Potassium 3.5 - 5.1 mmol/L 3.9  Chloride 98 - 111 mmol/L 107  CO2 22 - 32 mmol/L 23  Calcium 8.9 - 10.3 mg/dL 8.3(L)  Total Protein 6.5 - 8.1 g/dL 6.6  Total Bilirubin 0.3 - 1.2 mg/dL 0.5  Alkaline Phos 38 - 126 U/L 78  AST 15 - 41 U/L 31  ALT 0 - 44 U/L 36   Edinburgh Score: Edinburgh Postnatal Depression Scale Screening Tool 10/16/2021  I have been able to laugh and see the funny side of things. 0  I have looked forward with enjoyment to things. 0  I have blamed myself unnecessarily when things went wrong. 3  I have been anxious or worried for no good reason. 3  I have felt scared or panicky for no good reason. 2  Things have been getting on top of me. 2  I have been so unhappy that I have had difficulty sleeping. 0  I have felt sad or miserable. 1  I have been so unhappy that I have been crying. 0  The thought of harming myself has occurred to me. 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     Rice Anaphylaxis   Pineapple Itching   Sudafed [pseudoephedrine] Itching, Other (See Comments)   Reaction:  Dizziness and weakness    Banana Itching        Medication List     TAKE these medications    cetirizine 10 MG tablet Commonly known as: ZYRTEC Take 1 tablet (10 mg total) by mouth daily as needed for allergies.   ferrous sulfate 325 (65 FE) MG EC tablet Take 325 mg by mouth daily.   fexofenadine 180 MG tablet Commonly known as: ALLEGRA Take 1 tablet (180 mg total) by mouth daily.   lamoTRIgine 200 MG tablet Commonly known as: LAMICTAL Take 400 mg by mouth 2 (two) times daily.   montelukast 10 MG tablet Commonly known as: Singulair Take 1 tablet (10 mg total) by mouth at bedtime. What changed:  when to take  this reasons to take this   prenatal multivitamin Tabs tablet Take 1 tablet by mouth daily at 12 noon.   silver sulfADIAZINE 1 % cream Commonly known as: SILVADENE Apply 1 application topically daily.   terconazole 0.4 % vaginal cream Commonly known as: TERAZOL 7 Place 1 applicator vaginally at bedtime. Use for seven days               Discharge Care Instructions  (From admission, onward)           Start     Ordered   10/17/21 0000  Discharge wound care:       Comments: Sitz baths and icepacks to perineum.  If stitches, they will dissolve.   10/17/21 0718             Discharge home in stable condition Infant Feeding:  ? Infant Disposition:home with mother Discharge instruction: per After Visit Summary and Postpartum booklet. Activity: Advance as tolerated. Pelvic rest for 6 weeks.  Diet: routine diet Anticipated Birth Control: Unsure Postpartum Appointment:4 weeks Additional Postpartum F/U:  None Future Appointments:No future appointments. Follow up Visit:  [place] Follow up in 4 week(s).   Contact information: [place] [id number] [phone]                     10/17/2021 Daria Pastures, MD

## 2021-10-17 NOTE — Progress Notes (Signed)
Patient is eating, ambulating, voiding.  Pain control is good.  Vitals:   10/16/21 1656 10/16/21 1736 10/16/21 1942 10/17/21 0534  BP: 104/87 100/73 109/71 96/63  Pulse: 85 84 89 84  Resp: 16   19  Temp: 98.3 F (36.8 C)  98 F (36.7 C) 98 F (36.7 C)  TempSrc: Oral  Oral Oral  SpO2: 99%  100% 98%  Weight:      Height:        Fundus firm Perineum without swelling.  Lab Results  Component Value Date   WBC 13.7 (H) 10/16/2021   HGB 11.5 (L) 10/16/2021   HCT 33.5 (L) 10/16/2021   MCV 84.4 10/16/2021   PLT 133 (L) 10/16/2021    --/--/A POS (10/16 1446)/RI  A/P Post partum day 2.  Routine care.  Expect d/c today.    Loney Laurence

## 2021-10-17 NOTE — Social Work (Addendum)
CSW received consult for hx of Anxiety, Depression, Edinburgh 11.  CSW met with MOB to offer support and complete assessment.    CSW met with MOB at bedside and introduced CSW role. CSW observed MOB holding the infant with her eyes closed, FOB was up to get infant from MOB. MOB pleasantly greeted CSW. CSW offered MOB privacy. MOB welcomed CSW to complete the assessment with FOB present. CSW inquired how MOB has felt since giving birth. MOB expressed feeling good, just tired. MOB shared the L&D was very different than her last birth, she felt the contractions compared to last time. MOB shared FOB was in the L&D room to provide support. CSW inquired how MOB felt during the pregnancy. MOB reported experiencing anxiety and depression. MOB shared she was diagnosed with depression and anxiety in last year. MOB shared she lost her job earlier this year and it caused great stress. MOB reported she was offered medication to help with her symptoms however she did not take the medication. MOB reported she reached out to a counselor and completed paperwork for [facility] however she did not make an appointment. MOB shared she found employment in August and started feeling better. CSW discussed MOB [place]. MOB shared she answered the questions based the entire pregnancy.  MOB disclosed that she often suppresses her emotions and fears being labeled as "crazy." MOB shared she also has history of epilepsy which is treated with Lamictal and feels she need to assess her emotions as it related to her diagnosis. MOB verbalized self-awareness and feels therapy will be beneficial. CSW provided active listening, validated MOB concerns, and encouraged her make an appointed with a therapist. FOB expressed encouragement as well. MOB reported she listens to music and engages in arts and crafts to cope. MOB praised MOB for her efforts. CSW inquired if MOB experienced PPD. MOB reported she experienced PPD however was in denial.  CSW provided education regarding the baby blues period vs. perinatal mood disorders, discussed treatment and gave resources for mental health follow up if concerns arise.  CSW recommended MOB complete a self-evaluation during the postpartum time period using the New Mom Checklist from Postpartum Progress and encouraged MOB to contact a medical professional if symptoms are noted at any time. MOB reported she feels comfortable reaching out to her doctor if concerns arise. MOB denied SI/HI. CSW inquired about MOB supports. MOB identified FOB, family and friends as supports.   MOB reported she has essential items for the infant including a car seat and a bassinet where the infant will sleep. CSW provided review of Sudden Infant Death Syndrome (SIDS) precautions. MOB reported understanding. MOB has chosen [facility] for infant's follow up care. CSW assessed MOB for additional needs. MOB reported no further needs.   CSW identifies no further need for intervention and no barriers to discharge at this time.   Kathrin Greathouse, MSW, LCSW [facility] [place] [facility]  [phone] 2021-12-13  12:24 PM

## 2021-10-30 ENCOUNTER — Telehealth (HOSPITAL_COMMUNITY): Payer: Self-pay | Admitting: *Deleted

## 2021-10-30 NOTE — Telephone Encounter (Addendum)
No answer. Will fax Dr. Reina Fuse EPDS score of 11 from hospital stay.   Duffy Rhody, RN 10-30-2021 at 9:25am

## 2022-10-01 LAB — OB RESULTS CONSOLE HIV ANTIBODY (ROUTINE TESTING): HIV: NONREACTIVE

## 2022-10-01 LAB — OB RESULTS CONSOLE GC/CHLAMYDIA
Chlamydia: NEGATIVE
Neisseria Gonorrhea: NEGATIVE

## 2022-10-01 LAB — OB RESULTS CONSOLE ABO/RH: RH Type: POSITIVE

## 2022-10-01 LAB — OB RESULTS CONSOLE RPR: RPR: NONREACTIVE

## 2022-10-01 LAB — HEPATITIS C ANTIBODY: HCV Ab: NEGATIVE

## 2022-10-01 LAB — OB RESULTS CONSOLE HEPATITIS B SURFACE ANTIGEN: Hepatitis B Surface Ag: NEGATIVE

## 2022-10-01 LAB — OB RESULTS CONSOLE ANTIBODY SCREEN: Antibody Screen: NEGATIVE

## 2022-10-01 LAB — OB RESULTS CONSOLE RUBELLA ANTIBODY, IGM: Rubella: IMMUNE

## 2022-10-27 ENCOUNTER — Encounter (HOSPITAL_COMMUNITY): Payer: Self-pay | Admitting: Emergency Medicine

## 2022-10-27 ENCOUNTER — Observation Stay (HOSPITAL_COMMUNITY)
Admission: EM | Admit: 2022-10-27 | Discharge: 2022-10-28 | Disposition: A | Discharge status: Home / Self Care | Payer: Medicaid Other | Attending: Internal Medicine | Admitting: Internal Medicine

## 2022-10-27 ENCOUNTER — Other Ambulatory Visit: Payer: Self-pay

## 2022-10-27 DIAGNOSIS — O99511 Diseases of the respiratory system complicating pregnancy, first trimester: Secondary | ICD-10-CM | POA: Diagnosis not present

## 2022-10-27 DIAGNOSIS — Z79899 Other long term (current) drug therapy: Secondary | ICD-10-CM | POA: Diagnosis not present

## 2022-10-27 DIAGNOSIS — O0991 Supervision of high risk pregnancy, unspecified, first trimester: Secondary | ICD-10-CM | POA: Diagnosis not present

## 2022-10-27 DIAGNOSIS — O99281 Endocrine, nutritional and metabolic diseases complicating pregnancy, first trimester: Secondary | ICD-10-CM | POA: Insufficient documentation

## 2022-10-27 DIAGNOSIS — J45909 Unspecified asthma, uncomplicated: Secondary | ICD-10-CM | POA: Diagnosis not present

## 2022-10-27 DIAGNOSIS — Z3A13 13 weeks gestation of pregnancy: Secondary | ICD-10-CM | POA: Diagnosis not present

## 2022-10-27 DIAGNOSIS — R569 Unspecified convulsions: Secondary | ICD-10-CM | POA: Diagnosis not present

## 2022-10-27 DIAGNOSIS — G40909 Epilepsy, unspecified, not intractable, without status epilepticus: Secondary | ICD-10-CM | POA: Diagnosis not present

## 2022-10-27 DIAGNOSIS — D72829 Elevated white blood cell count, unspecified: Secondary | ICD-10-CM | POA: Diagnosis not present

## 2022-10-27 DIAGNOSIS — O99351 Diseases of the nervous system complicating pregnancy, first trimester: Principal | ICD-10-CM | POA: Insufficient documentation

## 2022-10-27 DIAGNOSIS — Z3490 Encounter for supervision of normal pregnancy, unspecified, unspecified trimester: Secondary | ICD-10-CM

## 2022-10-27 DIAGNOSIS — E876 Hypokalemia: Secondary | ICD-10-CM

## 2022-10-27 DIAGNOSIS — O99111 Other diseases of the blood and blood-forming organs and certain disorders involving the immune mechanism complicating pregnancy, first trimester: Secondary | ICD-10-CM | POA: Diagnosis not present

## 2022-10-27 DIAGNOSIS — Z87891 Personal history of nicotine dependence: Secondary | ICD-10-CM | POA: Diagnosis not present

## 2022-10-27 LAB — COMPREHENSIVE METABOLIC PANEL
ALT: 22 U/L (ref 0–44)
AST: 22 U/L (ref 15–41)
Albumin: 2.9 g/dL — ABNORMAL LOW (ref 3.5–5.0)
Alkaline Phosphatase: 99 U/L (ref 38–126)
Anion gap: 13 (ref 5–15)
BUN: 6 mg/dL (ref 6–20)
CO2: 19 mmol/L — ABNORMAL LOW (ref 22–32)
Calcium: 8.8 mg/dL — ABNORMAL LOW (ref 8.9–10.3)
Chloride: 103 mmol/L (ref 98–111)
Creatinine, Ser: 0.68 mg/dL (ref 0.44–1.00)
GFR, Estimated: >60 mL/min (ref >60)
Glucose, Bld: 92 mg/dL (ref 70–99)
Potassium: 3.4 mmol/L — ABNORMAL LOW (ref 3.5–5.1)
Sodium: 135 mmol/L (ref 135–145)
Total Bilirubin: 0.3 mg/dL (ref 0.3–1.2)
Total Protein: 6.3 g/dL — ABNORMAL LOW (ref 6.5–8.1)

## 2022-10-27 LAB — CBC WITH DIFFERENTIAL/PLATELET
Abs Immature Granulocytes: 0.07 10*3/uL (ref 0.00–0.07)
Basophils Absolute: 0 10*3/uL (ref 0.0–0.1)
Basophils Relative: 0 %
Eosinophils Absolute: 0.2 10*3/uL (ref 0.0–0.5)
Eosinophils Relative: 2 %
HCT: 33.7 % — ABNORMAL LOW (ref 36.0–46.0)
Hemoglobin: 11.7 g/dL — ABNORMAL LOW (ref 12.0–15.0)
Immature Granulocytes: 1 %
Lymphocytes Relative: 25 %
Lymphs Abs: 2.9 10*3/uL (ref 0.7–4.0)
MCH: 30.4 pg (ref 26.0–34.0)
MCHC: 34.7 g/dL (ref 30.0–36.0)
MCV: 87.5 fL (ref 80.0–100.0)
Monocytes Absolute: 0.7 10*3/uL (ref 0.1–1.0)
Monocytes Relative: 6 %
Neutro Abs: 7.7 10*3/uL (ref 1.7–7.7)
Neutrophils Relative %: 66 %
Platelets: 189 10*3/uL (ref 150–400)
RBC: 3.85 MIL/uL — ABNORMAL LOW (ref 3.87–5.11)
RDW: 13.6 % (ref 11.5–15.5)
WBC: 11.6 10*3/uL — ABNORMAL HIGH (ref 4.0–10.5)
nRBC: 0 % (ref 0.0–0.2)

## 2022-10-27 LAB — URINALYSIS, ROUTINE W REFLEX MICROSCOPIC
Bilirubin Urine: NEGATIVE
Glucose, UA: NEGATIVE mg/dL
Hgb urine dipstick: NEGATIVE
Ketones, ur: NEGATIVE mg/dL
Leukocytes,Ua: NEGATIVE
Nitrite: NEGATIVE
Protein, ur: 30 mg/dL — AB
Specific Gravity, Urine: 1.011 (ref 1.005–1.030)
pH: 6 (ref 5.0–8.0)

## 2022-10-27 LAB — HIV ANTIBODY (ROUTINE TESTING W REFLEX): HIV Screen 4th Generation wRfx: NONREACTIVE

## 2022-10-27 LAB — MAGNESIUM: Magnesium: 1.9 mg/dL (ref 1.7–2.4)

## 2022-10-27 LAB — HCG, QUANTITATIVE, PREGNANCY: hCG, Beta Chain, Quant, S: 115400 m[IU]/mL — ABNORMAL HIGH (ref <5)

## 2022-10-27 LAB — TSH: TSH: 0.553 u[IU]/mL (ref 0.350–4.500)

## 2022-10-27 LAB — CBG MONITORING, ED: Glucose-Capillary: 96 mg/dL (ref 70–99)

## 2022-10-27 MED ORDER — ENOXAPARIN SODIUM 40 MG/0.4ML IJ SOSY
40.0000 mg | PREFILLED_SYRINGE | INTRAMUSCULAR | Status: DC
  Administered 2022-10-27: 40 mg via SUBCUTANEOUS
  Filled 2022-10-27: qty 0.4

## 2022-10-27 MED ORDER — SODIUM CHLORIDE 0.9 % IV SOLN
20.0000 mg/kg | Freq: Once | INTRAVENOUS | Status: AC
  Administered 2022-10-27: 2500 mg via INTRAVENOUS
  Filled 2022-10-27: qty 25

## 2022-10-27 MED ORDER — LEVETIRACETAM 500 MG PO TABS
750.0000 mg | ORAL_TABLET | Freq: Two times a day (BID) | ORAL | Status: DC
  Administered 2022-10-27: 750 mg via ORAL
  Filled 2022-10-27: qty 1

## 2022-10-27 MED ORDER — ACETAMINOPHEN 325 MG PO TABS: 650.0000 mg | ORAL_TABLET | Freq: Four times a day (QID) | ORAL | Status: DC | PRN

## 2022-10-27 MED ORDER — ACETAMINOPHEN 650 MG RE SUPP: 650.0000 mg | Freq: Four times a day (QID) | RECTAL | Status: DC | PRN

## 2022-10-27 MED ORDER — LACTATED RINGERS IV BOLUS
1000.0000 mL | Freq: Once | INTRAVENOUS | Status: AC
  Administered 2022-10-27: 1000 mL via INTRAVENOUS

## 2022-10-27 MED ORDER — POTASSIUM CHLORIDE CRYS ER 20 MEQ PO TBCR
20.0000 meq | EXTENDED_RELEASE_TABLET | Freq: Once | ORAL | Status: AC
  Administered 2022-10-27: 20 meq via ORAL
  Filled 2022-10-27: qty 1

## 2022-10-27 MED ORDER — COMPLETENATE 29-1 MG PO CHEW
1.0000 | CHEWABLE_TABLET | Freq: Every day | ORAL | Status: DC
  Administered 2022-10-27 – 2022-10-28 (×2): 1 via ORAL
  Filled 2022-10-27 (×2): qty 1

## 2022-10-27 NOTE — Assessment & Plan Note (Addendum)
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 Still taking pregnancy dosed meds at 400mg  BID.  -has been on keppra in past and still had breakthrough seizures -neurology consulted, recommended load with keppra and lamictal level which is pending -f/u on neurology AED recommendations  -no s/s of infectious cause. Urine pending.  -check mag and TSH, UDS

## 2022-10-27 NOTE — Assessment & Plan Note (Signed)
G3P2 who is [redacted]w[redacted]d by EDD of 05/03/23.  No complaints of contractions, vaginal discharge or bleeding. BP well controlled Fetal heart tones could not be found by doppler so ultrasound was done and confirmed viable fetus with movement and heart tones OB consulted in ED who recommended FHT, but no other work up from their stand point Continue daily MV Pregnancy safe medication

## 2022-10-27 NOTE — ED Triage Notes (Signed)
Pt arrived by [facility] from home. Family witnessed  pt having seizure, lasting less than 5 mins. Per EMS pt post-ictal 20 mins. Take lamictal as prescribed. On arrival pt is a/o x4. Pt 4 months pregnant, [facility] for prenatal care.

## 2022-10-27 NOTE — ED Notes (Signed)
OB rapid response Stanton Kidney, RN  notified of pts arrival.

## 2022-10-27 NOTE — ED Provider Notes (Signed)
[facility] EMERGENCY DEPARTMENT Provider Note   CSN: [id number] Arrival date & time: 10/27/22  1037     History  Chief Complaint  Patient presents with   Seizures    Karina Nelson is a 31 y.o. female. With past medical history of epilepsy who presents to the emergency department with seizure.  Per EMS were called by family due to generalized seizure that lasted less than 5 minutes. With EMS patient was post-ictal. Was not given any anticonvulsants. Patient is reportedly 4 months pregnant.   On my exam, the patient continues to be post-ictal but is alert and oriented. She is somewhat confused on when her last seizure occurred. States she takes Lamictal and has been compliant on this. States she is "very" pregnant but cannot tell me how far along she is. Sees Karina Nelson with [facility]. States she has not had any BP issues during this pregnancy. Denies abdominal pain.    Seizures      Home Medications Prior to Admission medications   Medication Sig Start Date End Date Taking? Authorizing Provider  lamoTRIgine (LAMICTAL) 200 MG tablet Take 400 mg by mouth 2 (two) times daily. 02/02/20  Yes [provider]  Prenatal MV & Min w/FA-DHA (PRENATAL GUMMIES PO) Take 1 tablet by mouth daily.   Yes [provider]      Allergies    Food, Rice, Pineapple, Sudafed [pseudoephedrine], and Banana    Review of Systems   Review of Systems  Neurological:  Positive for seizures.  All other systems reviewed and are negative.   Physical Exam Updated Vital Signs BP (!) 99/55   Pulse (!) 108   Temp 98.5 F (36.9 C) (Oral)   Resp (!) 29   Ht 5\' 5"  (1.651 m)   Wt 125.2 kg   LMP  (LMP Unknown)   SpO2 100%   BMI 45.93 kg/m  Physical Exam Vitals and nursing note reviewed.  Constitutional:      General: She is not in acute distress.    Appearance: She is not toxic-appearing.  HENT:     Head: Normocephalic and atraumatic.     Nose: Nose  normal.     Mouth/Throat:     Mouth: Mucous membranes are moist.     Pharynx: Oropharynx is clear.     Comments: No tongue biting Eyes:     General: No scleral icterus.    Extraocular Movements: Extraocular movements intact.     Pupils: Pupils are equal, round, and reactive to light.  Cardiovascular:     Rate and Rhythm: Regular rhythm. Tachycardia present.     Pulses: Normal pulses.     Heart sounds: No murmur heard. Pulmonary:     Effort: Pulmonary effort is normal. No respiratory distress.     Breath sounds: Normal breath sounds.  Abdominal:     General: There is no distension.     Palpations: Abdomen is soft.     Tenderness: There is no abdominal tenderness.  Musculoskeletal:     Cervical back: Neck supple.  Skin:    General: Skin is warm and dry.     Capillary Refill: Capillary refill takes less than 2 seconds.     Findings: No rash.  Neurological:     General: No focal deficit present.     Mental Status: She is alert and oriented to person, place, and time.     Cranial Nerves: No cranial nerve deficit or facial asymmetry.  Sensory: Sensation is intact.     Motor: Motor function is intact.     Comments: Drowsy/post-ictal   Psychiatric:        Mood and Affect: Mood normal.        Behavior: Behavior normal.        Thought Content: Thought content normal.        Judgment: Judgment normal.     ED Results / Procedures / Treatments   Labs (all labs ordered are listed, but only abnormal results are displayed) Labs Reviewed  COMPREHENSIVE METABOLIC PANEL - Abnormal; Notable for the following components:      Result Value   Potassium 3.4 (*)    CO2 19 (*)    Calcium 8.8 (*)    Total Protein 6.3 (*)    Albumin 2.9 (*)    All other components within normal limits  CBC WITH DIFFERENTIAL/PLATELET - Abnormal; Notable for the following components:   WBC 11.6 (*)    RBC 3.85 (*)    Hemoglobin 11.7 (*)    HCT 33.7 (*)    All other components within normal limits   HCG, QUANTITATIVE, PREGNANCY - Abnormal; Notable for the following components:   hCG, Beta Chain, Quant, S 115,400 (*)    All other components within normal limits  URINALYSIS, ROUTINE W REFLEX MICROSCOPIC  LAMOTRIGINE LEVEL  CBG MONITORING, ED    EKG EKG Interpretation  Date/Time:  Saturday October 27 2022 10:47:35 EDT Ventricular Rate:  116 PR Interval:  144 QRS Duration: 84 QT Interval:  331 QTC Calculation: 460 R Axis:   36 Text Interpretation: Sinus tachycardia Confirmed by Vivien Rossetti ([id number]) on 10/27/2022 10:55:53 AM  Radiology No results found.  Procedures Procedures   Medications Ordered in ED Medications  levETIRAcetam (KEPPRA) 2,500 mg in sodium chloride 0.9 % 250 mL IVPB (has no administration in time range)    Followed by  levETIRAcetam (KEPPRA) tablet 750 mg (has no administration in time range)  lactated ringers bolus 1,000 mL (0 mLs Intravenous Stopped 10/27/22 1224)    ED Course/ Medical Decision Making/ A&P Clinical Course as of 10/27/22 1238  Sat Oct 27, 2022  1100 Spoke with Dr. Reina Fuse - confirm heart tones. States she is 13 and 1 days weeks pregnant. Due date is 05.03.24.  [LA]  1113 I have seen and evaluated the patient, she is no longer postictal and has no focal neurologic deficits.  She denies any injury or head hit from seizure.  It was witnessed by her mother.  She has known seizure disorder on Lamictal and does endorse intermittently missing doses of medications but not intentionally.  She is also approximately 3 months pregnant.  I confirmed fetal movement on bedside ultrasound.  Discussed case with neurology and on-call OB/GYN and likely discharge with outpatient follow-up. [VB]  1221 Neurology requesting admit for keppra load and further monitoring/workup. FHT 140s. [VB]    Clinical Course User Index [LA] Karina Peru, PA-C [VB] Karina Sayer, MD                           Medical Decision Making Amount and/or Complexity  of Data Reviewed Labs: ordered.  Risk Decision regarding hospitalization.  This patient presents to the ED with chief complaint(s) of seizure with pertinent past medical history of epilepsy which further complicates the presenting complaint. The complaint involves an extensive differential diagnosis and also carries with it a high risk of complications and morbidity.  The differential diagnosis includes seizure, eclampsia    Additional history obtained: Additional history obtained from family and EMS  Records reviewed previous admission documents, Care Everywhere/External Records, and Primary Care Documents  ED Course and Reassessment: 31 year old female who presents to the emergency department with generalized tonic-clonic seizure today.  Per family this was a global tonic-clonic seizure that lasted less than 5 minutes.  On her initial evaluation she was still mildly postictal but alert and oriented.  This is since cleared and she is back to her baseline mental status.  Seizure precautions ordered.  Will evaluate for possible eclampsia although she is not hypertensive and has no history of preeclampsia/eclampsia with previous pregnancy.  She does have known epilepsy.  Blood pressure here is normal, she is mildly tachycardic so we will give a bolus of fluid.  Not febrile. Labs without transaminitis, sodium derangement.  Glucose is normal.  Anemia is stable. Waiting for lamotrigine level We placed ultrasound on her and saw fetal movement and heartbeat of 141 bpm.  Consulted and spoke with Dr. Wilhelmenia Blase, OB/GYN who is familiar with the patient.  She recommended only fetal heart tones and no further work-up from an OB/GYN standpoint.  States that the patient has follow-up in 11/01/2022 for routine prenatal care.  Consulted and spoke with Dr. Quinn Axe, neurology.  She is can a place orders for Keppra loading.  Request Lamictal level which is already pending.  She is recommending hospitalist  admission for monitoring and medication bridging for seizure control in pregnancy.  Consulted and spoke with Dr. Rogers Blocker, hospitalist who agrees to admit patient.   Independent labs interpretation:  The following labs were independently interpreted: cbc stable anemia, cmp k 3.4, normal sodium and LFTs, glucose nl, urine and lamictal level pending   Independent visualization of imaging: Not indicated  Consultation: - Consulted or discussed management/test interpretation w/ external professional: Dr. Wilhelmenia Blase, OBGYN who recommends fetal heart tones but otherwise no work-up from OB/GYN standpoint.  She has follow-up on 11/01/2022.  Dr. Quinn Axe, neurology who recommends Keppra load, Lamictal level and hospitalist admission.Consulted and spoke with Dr. Rogers Blocker, hospitalist who agrees to admit patient.  Consideration for admission or further workup: Admission for seizure in pregnancy Social Determinants of health: Low health literacy Final Clinical Impression(s) / ED Diagnoses Final diagnoses:  Seizure [facility])    Rx / DC Orders ED Discharge Orders     None         Mickie Hillier, PA-C 10/27/22 1238    Elgie Congo, MD 10/27/22 [phone]

## 2022-10-27 NOTE — Assessment & Plan Note (Signed)
Physiological changes due to pregnancy vs. Stress induced from seizure No s/s/ of infection IVF bolus Trend

## 2022-10-27 NOTE — H&P (Signed)
History and Physical    Patient: Karina Nelson DOB: 1991/03/14 DOA: 10/27/2022 DOS: the patient was seen and examined on 10/27/2022 PCP: Nolene Ebbs, MD  Patient coming from: Home - lives with fiance and their kids, mother and her brother.    Chief Complaint: seizure   HPI: Karina Nelson is a 31 y.o. female with medical history significant of epilepsy, depression, ADHD who is [redacted]w[redacted]d pregnant who had a witnessed seizure this AM and Ems was called. Her fiance is in the room and helps with history. Around 10:10 this morning she started to have uncontrolled shaking in her right arm and then her body and then she blacked out. She was out for about 5 minutes. She urinated and bit her tongue. She states her presentation is typical for her seizures. She states it has been a couple of years since she had a seizure. Did not have any seizures with her last pregnancy. She was first diagnosed with seizures back in 2007-2008. She has not been taking her medication as prescribed and misses doses 1-2x/week. She is currently taking 400mg  BID. She has been on keppra in the past and still had breakthrough seizures.   She is a G3P2 and is [redacted]w[redacted]d by EDD. EDD: 05/03/2023. She denies any vaginal discharge or bleeding, cramping, headaches or vision changes. She does think she can feel some flutters. She is followed by Dr. Terri Piedra at [facility].  SVD on 10/16/21 at [redacted]w[redacted]d. Baby [place]. 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 Prior to pregnancy she was well controlled on lamotrigine 200mg  BID for several years. She does have history of medical noncompliance. Recommended to reduce lamotrigine to 350mg  in the AM and 400mg  at night. Also EEG was ordered.    She has been feeling good. Denies any fever/chills, vision changes/headaches, chest pain or palpitations, shortness of breath or  cough, abdominal pain, N/V/D, dysuria or leg swelling.    She does not smoke or drink alcohol.   ER Course:  vitals: afebrile, bp: 122/56, HR; 119, RR: 16, oxygen: 99%RA Pertinent labs: WBC: 11.6, hgb: 11.7, potassium: 3.4,  In ED: neurology and OB consulted. Loaded with keppra and given 1L IVF bolus.    Review of Systems: As mentioned in the history of present illness. All other systems reviewed and are negative. Past Medical History:  Diagnosis Date   Anemia    Anhedonia    Asthma    Depression    Epilepsy ([place])    migraines without aura    Scoliosis    Seasonal allergies    SVD (spontaneous vaginal delivery) 05/07/2017   Past Surgical History:  Procedure Laterality Date   OTHER SURGICAL HISTORY     cyst removal behind left ear   Social History:  reports that she has quit smoking. Her smoking use included cigars. She has never used smokeless tobacco. She reports that she does not currently use alcohol. She reports that she does not currently use drugs after having used the following drugs: Marijuana.  Allergies  Allergen Reactions   Food Anaphylaxis and Other (See Comments)    Pt is allergic to celery and strawberries.     Rice Anaphylaxis   Pineapple Itching   Sudafed [Pseudoephedrine] Itching and Other (See Comments)    Reaction:  Dizziness and weakness    Banana Itching    Family History  Problem Relation Age of  Onset   Hypertension Mother    Cancer Father    Heart disease Father    Diabetes Paternal Grandfather    Cancer Paternal Aunt     Prior to Admission medications   Medication Sig Start Date End Date Taking? Authorizing Provider  lamoTRIgine (LAMICTAL) 200 MG tablet Take 400 mg by mouth 2 (two) times daily. 02/02/20  Yes [provider]  Prenatal MV & Min w/FA-DHA (PRENATAL GUMMIES PO) Take 1 tablet by mouth daily.   Yes [provider]    Physical Exam: Vitals:   10/27/22 1123 10/27/22 1130 10/27/22 1214 10/27/22 1215  BP: (!)  115/57 104/60  (!) 99/55  Pulse: (!) 110 (!) 113  (!) 108  Resp: (!) 22 (!) 25 20 (!) 29  Temp:    98.5 F (36.9 C)  TempSrc:    Oral  SpO2: 100% 100%  100%  Weight:      Height:       General:  Appears calm and comfortable and is in NAD Eyes:  PERRL, EOMI, normal lids, iris ENT:  grossly normal hearing, lips & tongue, mmm; appropriate dentition. Laceration on left side of tongue. No bleeding.  Neck:  no LAD, masses or thyromegaly; no carotid bruits Cardiovascular:  RRR, no m/r/g. No LE edema.  Respiratory:   CTA bilaterally with no wheezes/rales/rhonchi.  Normal respiratory effort. Abdomen:  soft, NT, ND, NABS. Gravid below umbilicus.  Back:   normal alignment, no CVAT Skin:  no rash or induration seen on limited exam Musculoskeletal:  grossly normal tone BUE/BLE, good ROM, no bony abnormality Lower extremity:  No LE edema.  Limited foot exam with no ulcerations.  2+ distal pulses. Psychiatric:  grossly normal mood and affect, speech fluent and appropriate, AOx3 Neurologic:  CN 2-12 grossly intact, moves all extremities in coordinated fashion, sensation intact   Radiological Exams on Admission: Independently reviewed - see discussion in A/P where applicable  No results found.  EKG: Independently reviewed.  Sinus tachycardia with rate 121; nonspecific ST changes with no evidence of acute ischemia   Labs on Admission: I have personally reviewed the available labs and imaging studies at the time of the admission.  Pertinent labs:   WBC: 11.6,  hgb: 11.7,  potassium: 3.4,   Assessment and Plan: Principal Problem:   Seizure ([place]) Active Problems:   Pregnancy   Hypokalemia   Leukocytosis    Assessment and Plan: * Seizure ([place]) 31 year old female with history of focal epilepsy of uncertain etiology diagnosed back in 2007-2008 who presented to ED with witnessed 5 minute break through seizure likely in setting of medication non compliance and pregnancy -obs to  telemetry -seizure precautions -currently taking 400mg  lamictal BID, but misses 1-2 doses a week. Still taking pregnancy dosed meds at 400mg  BID.  -has been on keppra in past and still had breakthrough seizures -neurology consulted, recommended load with keppra and lamictal level which is pending -f/u on neurology AED recommendations  -no s/s of infectious cause. Urine pending.  -check mag and TSH, UDS   Pregnancy G3P2 who is [redacted]w[redacted]d by EDD of 05/03/23.  No complaints of contractions, vaginal discharge or bleeding. BP well controlled Fetal heart tones could not be found by doppler so ultrasound was done and confirmed viable fetus with movement and heart tones OB consulted in ED who recommended FHT, but no other work up from their stand point Continue daily MV Pregnancy safe medication   Hypokalemia Replete 68meq x1 PO Check mag Trend  Leukocytosis Physiological changes due to pregnancy vs. Stress induced from seizure No s/s/ of infection IVF bolus Trend     Advance Care Planning:   Code Status: Full Code   Consults: neurology/OB  DVT Prophylaxis: lovenox   Family Communication: fiance at bed side   Severity of Illness: The appropriate patient status for this patient is OBSERVATION. Observation status is judged to be reasonable and necessary in order to provide the required intensity of service to ensure the patient's safety. The patient's presenting symptoms, physical exam findings, and initial radiographic and laboratory data in the context of their medical condition is felt to place them at decreased risk for further clinical deterioration. Furthermore, it is anticipated that the patient will be medically stable for discharge from the hospital within 2 midnights of admission.   Author: Orma Flaming, MD 10/27/2022 1:14 PM  For on call review [email or web address].

## 2022-10-27 NOTE — Assessment & Plan Note (Signed)
Replete 10meq x1 PO Check mag Trend

## 2022-10-27 NOTE — Consult Note (Signed)
Neurology Consultation  Reason for Consult: seizures  Referring Physician: Dr. Artis Flock   CC: seizures   History is obtained from:patient and medical record   HPI: Karina Nelson Study is a 31 y.o. female with past medical hisotry of epilepsy, migraines, asthma and depression who presents to the hospital after a witnessed seizure lasting less than 5 minutes. She statesthis was a typical presentation of her seizures. She has bitten her tongue. She states she takes lamotrigine 400mg  BID, she endorses missing doses, can not quantify an estimate of how many doses she may miss on average during the week. Last dose possibly Thursday night. When asked why she misses doses she states she "just forgets", "has a lot going on". She tells me she does not want Keppra and does not want to switch her medications. She states she wants to go home. She feels as if she is back to baseline and knows why he had a seizure. She states she has been on depakote and keppra in the past but continued to have seizures on it.    ROS: Full ROS was performed and is negative except as noted in the HPI.    Past Medical History:  Diagnosis Date   Anemia    Anhedonia    Asthma    Depression    Epilepsy ([facility])    migraines without aura    Scoliosis    Seasonal allergies    SVD (spontaneous vaginal delivery) 05/07/2017     Family History  Problem Relation Age of Onset   Hypertension Mother    Cancer Father    Heart disease Father    Diabetes Paternal Grandfather    Cancer Paternal Aunt      Social History:   reports that she has quit smoking. Her smoking use included cigars. She has never used smokeless tobacco. She reports that she does not currently use alcohol. She reports that she does not currently use drugs after having used the following drugs: Marijuana.  Medications  Current Facility-Administered Medications:    levETIRAcetam (KEPPRA) 2,500 mg in sodium chloride 0.9 % 250 mL IVPB, 20 mg/kg, Intravenous, Once,  Last Rate: 916.7 mL/hr at 10/27/22 1238, 2,500 mg at 10/27/22 1238 **FOLLOWED BY** levETIRAcetam (KEPPRA) tablet 750 mg, 750 mg, Oral, BID, 10/29/22, MD  Current Outpatient Medications:    lamoTRIgine (LAMICTAL) 200 MG tablet, Take 400 mg by mouth 2 (two) times daily., Disp: , Rfl:    Prenatal MV & Min w/FA-DHA (PRENATAL GUMMIES PO), Take 1 tablet by mouth daily., Disp: , Rfl:    Exam: Current vital signs: BP (!) 99/55   Pulse (!) 108   Temp 98.5 F (36.9 C) (Oral)   Resp (!) 29   Ht 5\' 5"  (1.651 m)   Wt 125.2 kg   LMP  (LMP Unknown)   SpO2 100%   BMI 45.93 kg/m  Vital signs in last 24 hours: Temp:  [98.4 F (36.9 C)-98.5 F (36.9 C)] 98.5 F (36.9 C) (10/28 1215) Pulse Rate:  [108-121] 108 (10/28 1215) Resp:  [16-29] 29 (10/28 1215) BP: (99-122)/(55-60) 99/55 (10/28 1215) SpO2:  [98 %-100 %] 100 % (10/28 1215) Weight:  [125.2 kg] 125.2 kg (10/28 1053)  GENERAL: Awake, alert in NAD HEENT: - Normocephalic and atraumatic, dry mm LUNGS - Clear to auscultation bilaterally with no wheezes CV - S1S2 RRR, no m/r/g, equal pulses bilaterally. ABDOMEN - Soft, nontender, nondistended with normoactive BS Ext: warm, well perfused, intact peripheral pulses, no edema  NEURO:  Mental Status: AA&Ox3  Language: speech is clear.  Naming, repetition, fluency, and comprehension intact. Cranial Nerves: PERRL EOMI, visual fields full, no facial asymmetry, facial sensation intact, hearing intact, tongue/uvula/soft palate midline, normal sternocleidomastoid and trapezius muscle strength. No evidence of tongue atrophy or fibrillations Motor: 5/5 in all 4 extremities  Tone: is normal and bulk is normal Sensation- Intact to light touch bilaterally Coordination: FTN intact bilaterally, no ataxia in BLE. Gait- deferred   Labs I have reviewed labs in epic and the results pertinent to this consultation are:  CBC    Component Value Date/Time   WBC 11.6 (H) 10/27/2022 1050   RBC 3.85  (L) 10/27/2022 1050   HGB 11.7 (L) 10/27/2022 1050   HCT 33.7 (L) 10/27/2022 1050   PLT 189 10/27/2022 1050   MCV 87.5 10/27/2022 1050   MCH 30.4 10/27/2022 1050   MCHC 34.7 10/27/2022 1050   RDW 13.6 10/27/2022 1050   LYMPHSABS 2.9 10/27/2022 1050   MONOABS 0.7 10/27/2022 1050   EOSABS 0.2 10/27/2022 1050   BASOSABS 0.0 10/27/2022 1050    CMP     Component Value Date/Time   NA 135 10/27/2022 1050   NA 135 07/10/2019 1033   K 3.4 (L) 10/27/2022 1050   CL 103 10/27/2022 1050   CO2 19 (L) 10/27/2022 1050   GLUCOSE 92 10/27/2022 1050   BUN 6 10/27/2022 1050   BUN 6 07/10/2019 1033   CREATININE 0.68 10/27/2022 1050   CALCIUM 8.8 (L) 10/27/2022 1050   PROT 6.3 (L) 10/27/2022 1050   PROT 6.8 07/10/2019 1033   ALBUMIN 2.9 (L) 10/27/2022 1050   ALBUMIN 4.3 07/10/2019 1033   AST 22 10/27/2022 1050   ALT 22 10/27/2022 1050   ALKPHOS 99 10/27/2022 1050   BILITOT 0.3 10/27/2022 1050   BILITOT 0.3 07/10/2019 1033   GFRNONAA >60 10/27/2022 1050   GFRAA >60 06/06/2020 0936    Lipid Panel  No results found for: "CHOL", "TRIG", "HDL", "CHOLHDL", "VLDL", "LDLCALC", "LDLDIRECT"   Assessment:  31 y.o. female with past medical hisotry of epilepsy, migraines, asthma and depression who presents to the hospital after a witnessed seizure lasting less than 5 minutes.  Breakthrough seizures in the setting of medication noncompliance  Recommendations: - Seizure precautions  - lamotrigine level already sent  - Keppra 2500mg  IV x1 then 750 mg BID - Neurology will continue to follow   Beulah Gandy DNP, ACNPC-AG

## 2022-10-28 DIAGNOSIS — R569 Unspecified convulsions: Secondary | ICD-10-CM | POA: Diagnosis not present

## 2022-10-28 LAB — BASIC METABOLIC PANEL
Anion gap: 6 (ref 5–15)
BUN: 5 mg/dL — ABNORMAL LOW (ref 6–20)
CO2: 23 mmol/L (ref 22–32)
Calcium: 8.3 mg/dL — ABNORMAL LOW (ref 8.9–10.3)
Chloride: 107 mmol/L (ref 98–111)
Creatinine, Ser: 0.56 mg/dL (ref 0.44–1.00)
GFR, Estimated: >60 mL/min (ref >60)
Glucose, Bld: 85 mg/dL (ref 70–99)
Potassium: 3.2 mmol/L — ABNORMAL LOW (ref 3.5–5.1)
Sodium: 136 mmol/L (ref 135–145)

## 2022-10-28 LAB — CBC
HCT: 33 % — ABNORMAL LOW (ref 36.0–46.0)
Hemoglobin: 11.3 g/dL — ABNORMAL LOW (ref 12.0–15.0)
MCH: 30.1 pg (ref 26.0–34.0)
MCHC: 34.2 g/dL (ref 30.0–36.0)
MCV: 87.8 fL (ref 80.0–100.0)
Platelets: 198 10*3/uL (ref 150–400)
RBC: 3.76 MIL/uL — ABNORMAL LOW (ref 3.87–5.11)
RDW: 13.8 % (ref 11.5–15.5)
WBC: 9.9 10*3/uL (ref 4.0–10.5)
nRBC: 0 % (ref 0.0–0.2)

## 2022-10-28 MED ORDER — LAMOTRIGINE 150 MG PO TABS
400.0000 mg | ORAL_TABLET | Freq: Two times a day (BID) | ORAL | Status: DC
  Administered 2022-10-28 (×2): 400 mg via ORAL
  Filled 2022-10-28 (×3): qty 1

## 2022-10-28 MED ORDER — LAMOTRIGINE 200 MG PO TABS: 400.0000 mg | ORAL_TABLET | Freq: Two times a day (BID) | ORAL | 1 refills | Status: DC

## 2022-10-28 NOTE — ED Notes (Signed)
Pt given discharge instructions about picking up prescriptions on the way home to prevent any further seizures.  Pt and husband stated "they were on their way to the pharmacy on the way home".  Pt verbalized understanding of importance of prescriptions and following up with her OBGYN provider.

## 2022-10-28 NOTE — ED Notes (Signed)
Fetal heart tones heard via doppler around 126-130 at this time.  This RN and Vicente Males EMT at bedside.

## 2022-10-28 NOTE — Discharge Summary (Signed)
[facility]  Physician Discharge Summary   Patient ID: Karina Nelson MRN: [id number] DOB/AGE: 09/15/1991 31 y.o.  Admit date: 10/27/2022 Discharge date: 10/28/2022    PCP: [facility]  DISCHARGE DIAGNOSES:    Seizure ([place])   Pregnancy   Hypokalemia   Leukocytosis   RECOMMENDATIONS FOR OUTPATIENT FOLLOW UP: Patient instructed to follow-up with her neurologist and with her OB/GYN   Home Health: None Equipment/Devices: None  CODE STATUS: Full code  DISCHARGE CONDITION: Fair  Diet recommendation: As before  INITIAL HISTORY:  31 y.o. female with medical history significant of epilepsy, depression, ADHD who is [redacted]w[redacted]d pregnant who had a witnessed seizure this AM and Ems was called. Her fiance is in the room and helps with history. Around 10:10 this morning she started to have uncontrolled shaking in her right arm and then her body and then she blacked out. She was out for about 5 minutes. She urinated and bit her tongue. She states her presentation is typical for her seizures. She states it has been a couple of years since she had a seizure. Did not have any seizures with her last pregnancy. She was first diagnosed with seizures back in 2007-2008. She has not been taking her medication as prescribed and misses doses 1-2x/week. She is currently taking 400mg  BID. She has been on keppra in the past and still had breakthrough seizures.    She is a G3P2 and is [redacted]w[redacted]d by EDD. EDD: 05/03/2023. She denies any vaginal discharge or bleeding, cramping, headaches or vision changes. She does think she can feel some flutters. She is followed by Dr. Terri Piedra at [facility].  SVD on 10/16/21 at [redacted]w[redacted]d. Baby [place]. 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 Prior to pregnancy she was well controlled on lamotrigine 200mg  BID for several years. She does  have history of medical noncompliance. Recommended to reduce lamotrigine to 350mg  in the AM and 400mg  at night. Also EEG was ordered.      She has been feeling good. Denies any fever/chills, vision changes/headaches, chest pain or palpitations, shortness of breath or cough, abdominal pain, N/V/D, dysuria or leg swelling.      She does not smoke or drink alcohol.    ER Course:  vitals: afebrile, bp: 122/56, HR; 119, RR: 16, oxygen: 99%RA Pertinent labs: WBC: 11.6, hgb: 11.7, potassium: 3.4,  In ED: neurology and OB consulted. Loaded with keppra and given 1L IVF bolus.   Consultations: Neurology  Procedures: None   HOSPITAL COURSE:   Patient was admitted with episode of seizure activity.  She has known history of seizure.  Her breakthrough seizure was thought to be secondary to noncompliance.  She was seen by neurology.  She was started back on her usual regimen of Lamictal 400 mg twice daily.  Keppra was offered but patient declines.  No further seizure activity noted.  Cleared by neurology for discharge.  She is about [redacted] weeks pregnant.  No complications identified during this hospital stay.  She will follow-up with her OB/GYN.  Potassium was supplemented.  Obesity Estimated body mass index is 45.93 kg/m as calculated from the following:   Height as of this encounter: 5\' 5"  (1.651 m).   Weight as of this encounter: 125.2 kg.   Patient is stable for discharge.     PERTINENT LABS:  The results of significant diagnostics from this hospitalization (  including imaging, microbiology, ancillary and laboratory) are listed below for reference.    Labs:   Basic Metabolic Panel: Recent Labs  Lab 10/27/22 1050 10/27/22 1356 10/28/22 0601  NA 135  --  136  K 3.4*  --  3.2*  CL 103  --  107  CO2 19*  --  23  GLUCOSE 92  --  85  BUN 6  --  5*  CREATININE 0.68  --  0.56  CALCIUM 8.8*  --  8.3*  MG  --  1.9  --    Liver Function Tests: Recent Labs  Lab 10/27/22 1050   AST 22  ALT 22  ALKPHOS 99  BILITOT 0.3  PROT 6.3*  ALBUMIN 2.9*    CBC: Recent Labs  Lab 10/27/22 1050 10/28/22 0601  WBC 11.6* 9.9  NEUTROABS 7.7  --   HGB 11.7* 11.3*  HCT 33.7* 33.0*  MCV 87.5 87.8  PLT 189 198    CBG: Recent Labs  Lab 10/27/22 1205  GLUCAP 96     IMAGING STUDIES No results found.  DISCHARGE EXAMINATION: Vitals:   10/28/22 0741 10/28/22 0800 10/28/22 1100 10/28/22 1128  BP:  109/63 101/65   Pulse:  93 85   Resp:  19 19   Temp:    98 F (36.7 C)  TempSrc:    Oral  SpO2: 97% 98% 98%   Weight:      Height:       General appearance: Awake alert.  In no distress Resp: Clear to auscultation bilaterally.  Normal effort Cardio: S1-S2 is normal regular.  No S3-S4.  No rubs murmurs or bruit GI: Abdomen is soft.  Nontender nondistended.  Bowel sounds are present normal.  No masses organomegaly  DISPOSITION: Home  Discharge Instructions     Call MD for:  difficulty breathing, headache or visual disturbances   Complete by: As directed    Call MD for:  extreme fatigue   Complete by: As directed    Call MD for:  persistant dizziness or light-headedness   Complete by: As directed    Call MD for:  persistant nausea and vomiting   Complete by: As directed    Call MD for:  severe uncontrolled pain   Complete by: As directed    Call MD for:  temperature >100.4   Complete by: As directed    Diet general   Complete by: As directed    Discharge instructions   Complete by: As directed    Please take your medications as prescribed.  Please be sure to follow-up with your neurologist within the next 2 to 3 weeks.  You were cared for by a hospitalist during your hospital stay. If you have any questions about your discharge medications or the care you received while you were in the hospital after you are discharged, you can call the unit and asked to speak with the hospitalist on call if the hospitalist that took care of you is not available. Once you  are discharged, your primary care physician will handle any further medical issues. Please note that NO REFILLS for any discharge medications will be authorized once you are discharged, as it is imperative that you return to your primary care physician (or establish a relationship with a primary care physician if you do not have one) for your aftercare needs so that they can reassess your need for medications and monitor your lab values. If you do not have a primary care physician, you can  call [phone] for a physician referral.   Increase activity slowly   Complete by: As directed           Allergies as of 10/28/2022       Reactions   Food Anaphylaxis, Other (See Comments)   Pt is allergic to celery and strawberries.     Rice Anaphylaxis   Pineapple Itching   Sudafed [pseudoephedrine] Itching, Other (See Comments)   Reaction:  Dizziness and weakness    Banana Itching        Medication List     TAKE these medications    lamoTRIgine 200 MG tablet Commonly known as: LAMICTAL Take 2 tablets (400 mg total) by mouth 2 (two) times daily.   PRENATAL GUMMIES PO Take 1 tablet by mouth daily.          Follow-up Information     [facility] Follow up.   Contact information: [place] [address] [phone]         Sherlyn Hay, DO Follow up.   Specialty: Obstetrics and Gynecology Contact information: [place] [address] [phone]                 TOTAL DISCHARGE TIME: 88 Minutes  [place]  [facility] operational officer on [email or web address]  10/29/2022, 11:23 AM

## 2022-10-31 LAB — LAMOTRIGINE LEVEL: Lamotrigine Lvl: <1 ug/mL — ABNORMAL LOW (ref 2.0–20.0)

## 2022-12-31 NOTE — L&D Delivery Note (Signed)
Delivery Note Pt labored quickly to complete with uncontrollable urge to push. Pushed for about 5 mins once complete and at 4:18 PM a viable female was delivered via  (Presentation: ROA     ).  APGAR: 1,7,9 weight 9 lb 7 oz (4280 g).  Tight nuchal x 1 cut to be reduced at perineum. Anterior and posterior shoulders delivered with next two pushes; body easily followed next.  Placenta status: delivered, intact, schultz .  Cord: 3vc  with the following complications: none .  Cord pH: n/a  Anesthesia:  none Episiotomy:  none Lacerations:  none Suture Repair:  n/a Est. Blood Loss (mL):   Mom to postpartum.  Baby to Couplet care / Skin to Skin She would like circumcision for baby. DIscused and counseled.  Cathrine Muster 04/26/2023, 4:26 PM

## 2023-02-01 ENCOUNTER — Inpatient Hospital Stay (HOSPITAL_COMMUNITY)
Admission: AD | Admit: 2023-02-01 | Discharge: 2023-02-01 | Disposition: A | Discharge status: Home / Self Care | Payer: Medicaid Other | Attending: Obstetrics and Gynecology | Admitting: Obstetrics and Gynecology

## 2023-02-01 ENCOUNTER — Encounter (HOSPITAL_COMMUNITY): Payer: Self-pay | Admitting: *Deleted

## 2023-02-01 DIAGNOSIS — N949 Unspecified condition associated with female genital organs and menstrual cycle: Secondary | ICD-10-CM

## 2023-02-01 DIAGNOSIS — Z3A35 35 weeks gestation of pregnancy: Secondary | ICD-10-CM | POA: Diagnosis not present

## 2023-02-01 DIAGNOSIS — R102 Pelvic and perineal pain: Secondary | ICD-10-CM | POA: Diagnosis present

## 2023-02-01 LAB — URINALYSIS, ROUTINE W REFLEX MICROSCOPIC
Bilirubin Urine: NEGATIVE
Glucose, UA: NEGATIVE mg/dL
Hgb urine dipstick: NEGATIVE
Ketones, ur: NEGATIVE mg/dL
Leukocytes,Ua: NEGATIVE
Nitrite: NEGATIVE
Protein, ur: 30 mg/dL — AB
Specific Gravity, Urine: 1.023 (ref 1.005–1.030)
pH: 8 (ref 5.0–8.0)

## 2023-02-01 MED ORDER — CYCLOBENZAPRINE HCL 5 MG PO TABS: 5.0000 mg | ORAL_TABLET | Freq: Every day | ORAL | 0 refills | Status: DC

## 2023-02-01 MED ORDER — ACETAMINOPHEN 500 MG PO TABS
1000.0000 mg | ORAL_TABLET | Freq: Once | ORAL | Status: AC
  Administered 2023-02-01: 1000 mg via ORAL
  Filled 2023-02-01: qty 2

## 2023-02-01 MED ORDER — CYCLOBENZAPRINE HCL 5 MG PO TABS
5.0000 mg | ORAL_TABLET | Freq: Once | ORAL | Status: AC
  Administered 2023-02-01: 5 mg via ORAL
  Filled 2023-02-01: qty 1

## 2023-02-01 MED ORDER — ACETAMINOPHEN 500 MG PO TABS: 1000.0000 mg | ORAL_TABLET | Freq: Four times a day (QID) | ORAL | 2 refills | Status: DC | PRN

## 2023-02-01 NOTE — MAU Note (Signed)
Karina Nelson is a 32 y.o. at [redacted]w[redacted]d here in MAU reporting: having pelvic/groin pain.  Been having some but was able to tolerate it. Baby is very active.  Having sharp pain in LLQ, not going away. No bleeding, no LOF.  +FM. No diarrhea or constipation.  Onset of complaint: early this morning Pain score: 7 Vitals:   02/01/23 1759  BP: 125/77  Pulse: (!) 103  Resp: 18  Temp: 98.6 F (37 C)  SpO2: 100%     FHT:152 Lab orders placed from triage:  urine

## 2023-02-01 NOTE — MAU Provider Note (Cosign Needed Addendum)
History     CSN: [id number]  Arrival date and time: 02/01/23 1750   Event Date/Time   First Provider Initiated Contact with Patient 02/01/23 1834      Chief Complaint  Patient presents with   Pelvic Pain   er  Pelvic Pain The patient's primary symptoms include pelvic pain. The patient's pertinent negatives include no vaginal discharge. Pertinent negatives include no abdominal pain, back pain, chills, headaches or rash.    32 y/o F G3P2002 [redacted]w[redacted]d presents for left pelvic pain that has been ongoing for a the past month or so but began to be more persistent and worse starting this morning. Pt states the pain is exacerbated with certain positions and relieved if she lifts her belly. Denies having this problem in her previous pregnancies. Denies n/v/d, fever, chills, cramping, leakage of fluid, loss of fetal movement, or vaginal bleeding.    Past Medical History:  Diagnosis Date   Anemia    Anhedonia    Asthma    Depression    Epilepsy ([place])    migraines without aura    Scoliosis    Seasonal allergies    SVD (spontaneous vaginal delivery) 05/07/2017    Past Surgical History:  Procedure Laterality Date   OTHER SURGICAL HISTORY     cyst removal behind left ear    Family History  Problem Relation Age of Onset   Hypertension Mother    Cancer Father    Heart disease Father    Diabetes Paternal Grandfather    Cancer Paternal Aunt     Social History   Tobacco Use   Smoking status: Former    Types: Cigars   Smokeless tobacco: Never   Tobacco comments:    1-2 cigars a day  Vaping Use   Vaping Use: Never used  Substance Use Topics   Alcohol use: Not Currently    Alcohol/week: 0.0 standard drinks of alcohol    Comment: social   Drug use: Not Currently    Types: Marijuana    Allergies:  Allergies  Allergen Reactions   Food Anaphylaxis and Other (See Comments)    Pt is allergic to celery and strawberries.     Rice Anaphylaxis   Pineapple Itching   Sudafed  [Pseudoephedrine] Itching and Other (See Comments)    Reaction:  Dizziness and weakness    Banana Itching    Medications Prior to Admission  Medication Sig Dispense Refill Last Dose   lamoTRIgine (LAMICTAL) 200 MG tablet Take 2 tablets (400 mg total) by mouth 2 (two) times daily. 120 tablet 1 02/01/2023   Prenatal MV & Min w/FA-DHA (PRENATAL GUMMIES PO) Take 1 tablet by mouth daily.       Review of Systems  Constitutional:  Negative for activity change, appetite change and chills.  HENT:  Negative for congestion and dental problem.   Respiratory:  Negative for cough and shortness of breath.   Cardiovascular:  Negative for chest pain.  Gastrointestinal:  Negative for abdominal pain.  Genitourinary:  Positive for pelvic pain. Negative for vaginal bleeding and vaginal discharge.  Musculoskeletal:  Negative for back pain.  Skin:  Negative for color change and rash.  Neurological:  Negative for dizziness and headaches.   Physical Exam   Blood pressure 120/80, pulse (!) 107, temperature 98.6 F (37 C), temperature source Oral, resp. rate 18, height 5\' 5"  (1.651 m), weight 136.1 kg, SpO2 100 %, unknown if currently breastfeeding.  Physical Exam Vitals reviewed.  Constitutional:  Appearance: Normal appearance.  HENT:     Head: Normocephalic and atraumatic.     Nose: Nose normal. No congestion.     Mouth/Throat:     Mouth: Mucous membranes are dry.  Eyes:     Conjunctiva/sclera: Conjunctivae normal.  Cardiovascular:     Rate and Rhythm: Normal rate and regular rhythm.  Pulmonary:     Effort: Pulmonary effort is normal.     Breath sounds: Normal breath sounds.  Abdominal:     General: Abdomen is flat.     Palpations: Abdomen is soft.     Comments: No reproducible pain to the abd or where pt points to the pain (left inguinal area), but she did feel pain when she was getting up.   Musculoskeletal:     Cervical back: Normal range of motion and neck supple.  Skin:    General:  Skin is warm and dry.  Neurological:     Mental Status: She is alert.     MAU Course  Procedures Results for orders placed or performed during the hospital encounter of 02/01/23 (from the past 24 hour(s))  Urinalysis, Routine w reflex microscopic -Urine, Clean Catch     Status: Abnormal   Collection Time: 02/01/23  6:14 PM  Result Value Ref Range   Color, Urine YELLOW YELLOW   APPearance CLEAR CLEAR   Specific Gravity, Urine 1.023 1.005 - 1.030   pH 8.0 5.0 - 8.0   Glucose, UA NEGATIVE NEGATIVE mg/dL   Hgb urine dipstick NEGATIVE NEGATIVE   Bilirubin Urine NEGATIVE NEGATIVE   Ketones, ur NEGATIVE NEGATIVE mg/dL   Protein, ur 30 (A) NEGATIVE mg/dL   Nitrite NEGATIVE NEGATIVE   Leukocytes,Ua NEGATIVE NEGATIVE   RBC / HPF 0-5 0 - 5 RBC/hpf   WBC, UA 0-5 0 - 5 WBC/hpf   Bacteria, UA RARE (A) NONE SEEN   Squamous Epithelial / HPF 0-5 0 - 5 /HPF   Mucus PRESENT     MDM 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 Pt not feeling any contractions, and FHT reassuring. UA appeared that pt was dehydrated, but no infection. Offered cervical check if she was concerned she was in labor but she deferred. Will give flexeril, tylenol, and a big cup of water, reassess and then likely discharge.   Assessment and Plan  #Symphysis Pubis Dysfunction - prescribed tylenol and flexeril, also recommended the pt buy a high waisted maternity belt. Pt feels reassured and agreed with plan. Discussed return precautions.   Abram Sander 02/01/2023, 7:07 PM   Attestation of Supervision of Student:  I confirm that I have verified the information documented in the  resident  student's note and that I have also personally reperformed the history, physical exam and all medical decision making activities.  I have verified that all services and findings are accurately documented in this student's note; and I agree with management and plan as outlined in the documentation. I have also made any necessary  editorial changes.  Wende Mott, [place] for [employer] 02/01/2023 7:46 PM

## 2023-04-15 LAB — OB RESULTS CONSOLE GBS: GBS: NEGATIVE

## 2023-04-24 ENCOUNTER — Telehealth (HOSPITAL_COMMUNITY): Payer: Self-pay | Admitting: *Deleted

## 2023-04-24 NOTE — Telephone Encounter (Signed)
Preadmission screen  

## 2023-04-25 ENCOUNTER — Encounter (HOSPITAL_COMMUNITY): Payer: Self-pay | Admitting: *Deleted

## 2023-04-26 ENCOUNTER — Encounter (HOSPITAL_COMMUNITY): Payer: Self-pay | Admitting: Obstetrics and Gynecology

## 2023-04-26 ENCOUNTER — Inpatient Hospital Stay (HOSPITAL_COMMUNITY)
Admission: RE | Admit: 2023-04-26 | Discharge: 2023-04-27 | DRG: 806 | Disposition: A | Discharge status: Home / Self Care | Payer: Medicaid Other | Attending: Obstetrics | Admitting: Obstetrics

## 2023-04-26 ENCOUNTER — Inpatient Hospital Stay (HOSPITAL_COMMUNITY): Payer: Medicaid Other

## 2023-04-26 ENCOUNTER — Other Ambulatory Visit: Payer: Self-pay

## 2023-04-26 DIAGNOSIS — O3663X0 Maternal care for excessive fetal growth, third trimester, not applicable or unspecified: Principal | ICD-10-CM | POA: Diagnosis present

## 2023-04-26 DIAGNOSIS — Z3A39 39 weeks gestation of pregnancy: Secondary | ICD-10-CM

## 2023-04-26 DIAGNOSIS — G40909 Epilepsy, unspecified, not intractable, without status epilepticus: Secondary | ICD-10-CM | POA: Diagnosis present

## 2023-04-26 DIAGNOSIS — Z87891 Personal history of nicotine dependence: Secondary | ICD-10-CM

## 2023-04-26 DIAGNOSIS — O9902 Anemia complicating childbirth: Secondary | ICD-10-CM | POA: Diagnosis present

## 2023-04-26 DIAGNOSIS — O99354 Diseases of the nervous system complicating childbirth: Secondary | ICD-10-CM | POA: Diagnosis present

## 2023-04-26 DIAGNOSIS — Z3490 Encounter for supervision of normal pregnancy, unspecified, unspecified trimester: Secondary | ICD-10-CM

## 2023-04-26 LAB — CBC
HCT: 31.3 % — ABNORMAL LOW (ref 36.0–46.0)
Hemoglobin: 10 g/dL — ABNORMAL LOW (ref 12.0–15.0)
MCH: 26.7 pg (ref 26.0–34.0)
MCHC: 31.9 g/dL (ref 30.0–36.0)
MCV: 83.5 fL (ref 80.0–100.0)
Platelets: 151 10*3/uL (ref 150–400)
RBC: 3.75 MIL/uL — ABNORMAL LOW (ref 3.87–5.11)
RDW: 16 % — ABNORMAL HIGH (ref 11.5–15.5)
WBC: 7.4 10*3/uL (ref 4.0–10.5)
nRBC: 0 % (ref 0.0–0.2)

## 2023-04-26 LAB — TYPE AND SCREEN
ABO/RH(D): A POS
Antibody Screen: NEGATIVE

## 2023-04-26 LAB — RPR: RPR Ser Ql: NONREACTIVE

## 2023-04-26 MED ORDER — OXYCODONE-ACETAMINOPHEN 5-325 MG PO TABS: 1.0000 | ORAL_TABLET | ORAL | Status: DC | PRN

## 2023-04-26 MED ORDER — SOD CITRATE-CITRIC ACID 500-334 MG/5ML PO SOLN: 30.0000 mL | ORAL | Status: DC | PRN

## 2023-04-26 MED ORDER — ONDANSETRON HCL 4 MG PO TABS: 4.0000 mg | ORAL_TABLET | ORAL | Status: DC | PRN

## 2023-04-26 MED ORDER — TERBUTALINE SULFATE 1 MG/ML IJ SOLN: 0.2500 mg | Freq: Once | INTRAMUSCULAR | Status: DC | PRN

## 2023-04-26 MED ORDER — COCONUT OIL OIL: 1.0000 | TOPICAL_OIL | Status: DC | PRN

## 2023-04-26 MED ORDER — DIPHENHYDRAMINE HCL 25 MG PO CAPS: 25.0000 mg | ORAL_CAPSULE | Freq: Four times a day (QID) | ORAL | Status: DC | PRN

## 2023-04-26 MED ORDER — LACTATED RINGERS IV SOLN: INTRAVENOUS | Status: DC

## 2023-04-26 MED ORDER — LACTATED RINGERS IV SOLN: 500.0000 mL | INTRAVENOUS | Status: DC | PRN

## 2023-04-26 MED ORDER — ACETAMINOPHEN 325 MG PO TABS: 650.0000 mg | ORAL_TABLET | ORAL | Status: DC | PRN

## 2023-04-26 MED ORDER — SIMETHICONE 80 MG PO CHEW: 80.0000 mg | CHEWABLE_TABLET | ORAL | Status: DC | PRN

## 2023-04-26 MED ORDER — IBUPROFEN 600 MG PO TABS
600.0000 mg | ORAL_TABLET | Freq: Four times a day (QID) | ORAL | Status: DC
  Administered 2023-04-26 – 2023-04-27 (×4): 600 mg via ORAL
  Filled 2023-04-26 (×4): qty 1

## 2023-04-26 MED ORDER — OXYTOCIN-SODIUM CHLORIDE 30-0.9 UT/500ML-% IV SOLN
2.5000 [IU]/h | INTRAVENOUS | Status: DC
  Administered 2023-04-26: 2.5 [IU]/h via INTRAVENOUS
  Filled 2023-04-26: qty 500

## 2023-04-26 MED ORDER — SENNOSIDES-DOCUSATE SODIUM 8.6-50 MG PO TABS
2.0000 | ORAL_TABLET | Freq: Every day | ORAL | Status: DC
  Administered 2023-04-27: 2 via ORAL
  Filled 2023-04-26: qty 2

## 2023-04-26 MED ORDER — ERYTHROMYCIN 5 MG/GM OP OINT
TOPICAL_OINTMENT | OPHTHALMIC | Status: AC
  Filled 2023-04-26: qty 1

## 2023-04-26 MED ORDER — PRENATAL MULTIVITAMIN CH
1.0000 | ORAL_TABLET | Freq: Every day | ORAL | Status: DC
  Administered 2023-04-27: 1 via ORAL
  Filled 2023-04-26: qty 1

## 2023-04-26 MED ORDER — LAMOTRIGINE 150 MG PO TABS
400.0000 mg | ORAL_TABLET | Freq: Two times a day (BID) | ORAL | Status: DC
  Administered 2023-04-26: 400 mg via ORAL
  Filled 2023-04-26 (×2): qty 1

## 2023-04-26 MED ORDER — ONDANSETRON HCL 4 MG/2ML IJ SOLN: 4.0000 mg | Freq: Four times a day (QID) | INTRAMUSCULAR | Status: DC | PRN

## 2023-04-26 MED ORDER — FENTANYL CITRATE (PF) 100 MCG/2ML IJ SOLN: 50.0000 ug | INTRAMUSCULAR | Status: DC | PRN

## 2023-04-26 MED ORDER — LIDOCAINE HCL (PF) 1 % IJ SOLN: 30.0000 mL | INTRAMUSCULAR | Status: DC | PRN

## 2023-04-26 MED ORDER — OXYCODONE HCL 5 MG PO TABS: 10.0000 mg | ORAL_TABLET | ORAL | Status: DC | PRN

## 2023-04-26 MED ORDER — LAMOTRIGINE 150 MG PO TABS: 400.0000 mg | ORAL_TABLET | Freq: Two times a day (BID) | ORAL | Status: DC

## 2023-04-26 MED ORDER — OXYTOCIN-SODIUM CHLORIDE 30-0.9 UT/500ML-% IV SOLN
1.0000 m[IU]/min | INTRAVENOUS | Status: DC
  Administered 2023-04-26: 2 m[IU]/min via INTRAVENOUS
  Filled 2023-04-26: qty 500

## 2023-04-26 MED ORDER — OXYCODONE-ACETAMINOPHEN 5-325 MG PO TABS: 2.0000 | ORAL_TABLET | ORAL | Status: DC | PRN

## 2023-04-26 MED ORDER — ZOLPIDEM TARTRATE 5 MG PO TABS: 5.0000 mg | ORAL_TABLET | Freq: Every evening | ORAL | Status: DC | PRN

## 2023-04-26 MED ORDER — TETANUS-DIPHTH-ACELL PERTUSSIS 5-2.5-18.5 LF-MCG/0.5 IM SUSY: 0.5000 mL | PREFILLED_SYRINGE | Freq: Once | INTRAMUSCULAR | Status: DC

## 2023-04-26 MED ORDER — BENZOCAINE-MENTHOL 20-0.5 % EX AERO
1.0000 | INHALATION_SPRAY | CUTANEOUS | Status: DC | PRN
  Administered 2023-04-26 – 2023-04-27 (×2): 1 via TOPICAL
  Filled 2023-04-26 (×2): qty 56

## 2023-04-26 MED ORDER — OXYTOCIN BOLUS FROM INFUSION
333.0000 mL | Freq: Once | INTRAVENOUS | Status: AC
  Administered 2023-04-26: 333 mL via INTRAVENOUS

## 2023-04-26 MED ORDER — WITCH HAZEL-GLYCERIN EX PADS: 1.0000 | MEDICATED_PAD | CUTANEOUS | Status: DC | PRN

## 2023-04-26 MED ORDER — DIBUCAINE (PERIANAL) 1 % EX OINT: 1.0000 | TOPICAL_OINTMENT | CUTANEOUS | Status: DC | PRN

## 2023-04-26 MED ORDER — ONDANSETRON HCL 4 MG/2ML IJ SOLN: 4.0000 mg | INTRAMUSCULAR | Status: DC | PRN

## 2023-04-26 MED ORDER — OXYCODONE HCL 5 MG PO TABS: 5.0000 mg | ORAL_TABLET | ORAL | Status: DC | PRN

## 2023-04-26 NOTE — H&P (Addendum)
Karina Nelson is a 32 y.o.G36P2002 female presenting for scheduled IOL due to macrosomia. Pt is dated per LMP which was confirmed with a 9week [place].  Her pregnancy has been complicated by : LGA - EFW 97%ile 9lbs at 37wks Anemia of pregnancy - iron supps daily Asthma - no meds Epilepsy - on lamictal and keppra stable ADHD - no meds   Expected range NIPt, SMA pos. Received Tdap. GBS neg  OB History     Gravida  3   Para  2   Term  2   Preterm      AB      Living  2      SAB      IAB      Ectopic      Multiple  0   Live Births  2          Past Medical History:  Diagnosis Date   Anemia    Anhedonia    Asthma    Depression    Epilepsy ([facility])    09/2022 last seizure   migraines without aura    Scoliosis    Seasonal allergies    SVD (spontaneous vaginal delivery) 05/07/2017   Past Surgical History:  Procedure Laterality Date   OTHER SURGICAL HISTORY     cyst removal behind left ear   Family History: family history includes Cancer in her father and paternal aunt; Diabetes in her paternal grandfather; Heart disease in her father; Hypertension in her mother. Social History:  reports that she has quit smoking. Her smoking use included cigars. She has never used smokeless tobacco. She reports that she does not currently use alcohol. She reports that she does not currently use drugs after having used the following drugs: Marijuana.     Maternal Diabetes: No Genetic Screening: Normal Maternal Ultrasounds/Referrals: Normal Fetal Ultrasounds or other Referrals:  None Maternal Substance Abuse:  No Significant Maternal Medications:  Meds include: Other: see HPI Significant Maternal Lab Results:  Group B Strep negative Number of Prenatal Visits:greater than 3 verified prenatal visits Other Comments:  None  Review of Systems  Constitutional:  Positive for fatigue. Negative for activity change.  Eyes:  Negative for photophobia and visual disturbance.  Respiratory:   Positive for shortness of breath. Negative for chest tightness.   Cardiovascular:  Positive for leg swelling. Negative for chest pain and palpitations.  Gastrointestinal:  Positive for abdominal pain.  Genitourinary:  Negative for menstrual problem and vaginal bleeding.  Musculoskeletal:  Positive for back pain.  Neurological:  Negative for light-headedness and headaches.  Psychiatric/Behavioral:  The patient is not nervous/anxious.    Maternal Medical History:  Reason for admission: IOL for macrosomia   Contractions: Frequency: rare.   Perceived severity is mild.   Fetal activity: Perceived fetal activity is normal.   Prenatal complications: no prenatal complications Prenatal Complications - Diabetes: none.   Dilation: 4 Effacement (%): 50 Station: -3 Exam by:: Davis,RN Blood pressure 135/81, pulse 95, temperature 98.3 F (36.8 C), temperature source Oral, resp. rate 16, height 5\' 5"  (1.651 m), weight (!) 144 kg, unknown if currently breastfeeding. Maternal Exam:  Uterine Assessment: Contraction strength is mild.  Contraction frequency is irregular.  Abdomen: Patient reports generalized tenderness.  Estimated fetal weight is LGA EFW 97%ile.   Fetal presentation: vertex Introitus: Normal vulva. Normal vagina.  Pelvis: adequate for delivery.   Cervix: Cervix evaluated by digital exam.     Fetal Exam Fetal Monitor Review: Baseline rate: 120.  Variability:  moderate (6-25 bpm).   Pattern: accelerations present and no decelerations.   Fetal State Assessment: Category I - tracings are normal.   Physical Exam Constitutional:      Appearance: Normal appearance. She is normal weight.  Pulmonary:     Effort: Pulmonary effort is normal.  Abdominal:     Tenderness: There is generalized abdominal tenderness.  Genitourinary:    General: Normal vulva.  Musculoskeletal:        General: Normal range of motion.     Cervical back: Normal range of motion.  Skin:    General: Skin is  warm.     Capillary Refill: Capillary refill takes 2 to 3 seconds.  Neurological:     General: No focal deficit present.     Mental Status: She is alert and oriented to person, place, and time. Mental status is at baseline.  Psychiatric:        Mood and Affect: Mood normal.        Behavior: Behavior normal.        Thought Content: Thought content normal.        Judgment: Judgment normal.     Prenatal labs: ABO, Rh: --/--/A POS (04/26 1610) Antibody: NEG (04/26 9604) Rubella: Immune (10/02 0000) RPR: Nonreactive (10/02 0000)  HBsAg: Negative (10/02 0000)  HIV: Non Reactive (10/28 1356)  GBS: Negative/-- (04/15 0000)   Assessment/Plan: 53 female here for IOL due to macrosomia - Admit  - Pitocin per protocol ; Pit at , AROM clear 5.5/60/-3 - Pain control prn  - Counseled pt on shoulder dystocia and manuevers to expect if occurs - GBS neg - Anticipate svd    Karina Nelson 04/26/2023, 9:18 AM

## 2023-04-26 NOTE — Lactation Note (Signed)
This note was copied from a baby's chart. Lactation Consultation Note  Patient Name: Boy [place] Yip Today's Date: 04/26/2023 Age:33 hours Reason for consult: Initial assessment;Term  Per Birth Parent, infant was given 10 mls of donor breast milk ( 2007 pm) prior to [facility] entering the room, Birth Parent is recovering and tired would like to latch infant to breast later tonight. Birth Parent had latch difficulties with previous two children so she, "pumped only" for 2 months with 1st  child and one month with her 2nd child. Birth Parent would like latch assistance and knows to call RN/LC for latch assistance when she is ready to latch infant at the breast. To help stimulate and establish Birth Parent's milk supply LC set Birth Parent up with DEBP using size 24 mm breast flange. Birth Parent was pumping and expressing colostrum in breast flange when LC left the room. Birth Parent will continue to use DEBP every 3 hours for 15 minutes on initial setting. LC discussed infant's input and output, the importance of maternal rest, diet and hydration. Birth Parent was  made aware of O/P services, breastfeeding support groups, community resources, and our phone # for post-discharge questions.    Birth Parent understands that donor breast milk must be used within 1 hour at room temperature whereas EBM is safe at room temperature for 4 hours. Birth Parent was given handout, Storage and Preparation of Breast Milk." Birth Parent's current feeding plan: 1- Birth Parent when ready will ask RN/LC for latch assistance if needed, Birth Parent knows to BF/ offer donor breast milk by  infant cues, on demand, 8 -12 + times within 24 hours. 2- Birth Parent will continue to use DEBP every 3 hours for 15 minutes on initial setting and give infant back any EBM first before offering donor breast milk. 3- Birth Parent given handout " Supplementing with Breast Milk", Birth Parent knows if infant doesn't latch to offer 10 mls + donor  breast milk or more per feeding on day 1.  Maternal Data Has patient been taught Hand Expression?: Yes Does the patient have breastfeeding experience prior to this delivery?: Yes How long did the patient breastfeed?: Per Birth Parent, She had latch difficulties with other two children so she pumped only, 1st child she pumped for 2 months and 2nd child one month  Feeding Mother's Current Feeding Choice: Breast Milk and Donor Milk Nipple Type: Slow - flow  LATCH Score   Birth Parent has not latched infant at breast yet and offering donor breast milk.                 Lactation Tools Discussed/Used Tools: Pump Breast pump type: Double-Electric Breast Pump Pump Education: Setup, frequency, and cleaning;Milk Storage Reason for Pumping: Birth Parent is pumping only and supplementing with donor milk currently, she does plan to latch infant later tonight when feeling better. Pumping frequency: Birth Parent was expressing colostrum in breast flanges as LC was leaving the room, Birth Parent will continue to pump every 3 hours for 15 minutes on inital setting.  Interventions Interventions: Breast feeding basics reviewed;Skin to skin;Education;Pace feeding;LC Services brochure;Breast massage;DEBP  Discharge Pump: DEBP;Personal  Consult Status Consult Status: Follow-up Date: 04/27/23 Follow-up type: In-patient    Frederico Hamman 04/26/2023, 9:15 PM

## 2023-04-27 LAB — CBC
HCT: 30.8 % — ABNORMAL LOW (ref 36.0–46.0)
Hemoglobin: 10.2 g/dL — ABNORMAL LOW (ref 12.0–15.0)
MCH: 26.8 pg (ref 26.0–34.0)
MCHC: 33.1 g/dL (ref 30.0–36.0)
MCV: 80.8 fL (ref 80.0–100.0)
Platelets: 158 10*3/uL (ref 150–400)
RBC: 3.81 MIL/uL — ABNORMAL LOW (ref 3.87–5.11)
RDW: 16.4 % — ABNORMAL HIGH (ref 11.5–15.5)
WBC: 14.4 10*3/uL — ABNORMAL HIGH (ref 4.0–10.5)
nRBC: 0 % (ref 0.0–0.2)

## 2023-04-27 MED ORDER — IBUPROFEN 600 MG PO TABS: 600.0000 mg | ORAL_TABLET | Freq: Four times a day (QID) | ORAL | 0 refills | Status: DC | PRN

## 2023-04-27 MED ORDER — LAMOTRIGINE 150 MG PO TABS
450.0000 mg | ORAL_TABLET | Freq: Two times a day (BID) | ORAL | Status: DC
  Administered 2023-04-27: 450 mg via ORAL
  Filled 2023-04-27 (×2): qty 3

## 2023-04-27 MED ORDER — ACETAMINOPHEN 325 MG PO TABS: 650.0000 mg | ORAL_TABLET | Freq: Four times a day (QID) | ORAL | Status: DC | PRN

## 2023-04-27 NOTE — Progress Notes (Addendum)
Post Partum Day 1 Subjective: Patient is doing well this morning. Pain is controlled. Ambulating, voiding, tolerating PO. Minimal lochia. Breastfeeding.   Objective: Patient Vitals for the past 24 hrs:  BP Temp Temp src Pulse Resp SpO2  04/27/23 0453 109/80 98.3 F (36.8 C) Oral (!) 106 17 100 %  04/27/23 0041 (!) 120/55 98.8 F (37.1 C) Oral (!) 125 19 98 %  04/26/23 2020 115/75 98.1 F (36.7 C) Oral (!) 103 20 100 %  04/26/23 1820 120/72 98.2 F (36.8 C) Oral (!) 104 18 --  04/26/23 1716 117/70 -- -- (!) 121 -- --  04/26/23 1701 106/78 -- -- (!) 104 18 --  04/26/23 1650 122/76 -- -- (!) 105 18 --  04/26/23 1418 (!) 135/97 98 F (36.7 C) Oral 89 20 --  04/26/23 1200 118/62 -- -- 95 -- --  04/26/23 1155 130/81 97.7 F (36.5 C) Oral 94 18 --  04/26/23 1059 134/84 -- -- 94 -- --  04/26/23 1002 122/80 -- -- (!) 101 -- --  04/26/23 0939 138/80 97.7 F (36.5 C) Oral (!) 104 18 --    Physical Exam:  General: alert, cooperative, and no distress Lochia: appropriate Uterine Fundus: firm DVT Evaluation: No evidence of DVT seen on physical exam.  Recent Labs    04/26/23 0827 04/27/23 0525  WBC 7.4 14.4*  HGB 10.0* 10.2*  HCT 31.3* 30.8*  PLT 151 158    No results for input(s): "NA", "K", "CL", "CO2CT", "BUN", "CREATININE", "GLUCOSE", "BILITOT", "ALT", "AST", "ALKPHOS", "PROT", "ALBUMIN" in the last 72 hours.  No results for input(s): "CALCIUM", "MG", "PHOS" in the last 72 hours.  No results for input(s): "PROTIME", "APTT", "INR" in the last 72 hours.  No results for input(s): "PROTIME", "APTT", "INR", "FIBRINOGEN" in the last 72 hours. Assessment/Plan: Karina Nelson 32 y.o. G3P3003 PPD#1 sp SVD 1. PPC: routine PP care 2. Rh pos 3. Desires neonatal circumcision, R/B/A of procedure discussed at length. Pt understands that neonatal circumcision is not considered medically necessary and is elective. The risks include, but are not limited to bleeding, infection, damage to the  penis, development of scar tissue, and having to have it redone at a later date. Pt understands theses risks and wishes to proceed.  4. Epilepsy: continue home lamictal/keppra 5. Dispo: desires early discharge home this afternoon if baby cleared by pediatrician. Otherwise d/c home tomorrow. Discharge instructions reviewed.    LOS: 1 day   Charlett Nose 04/27/2023, 9:05 AM

## 2023-04-27 NOTE — Progress Notes (Signed)
CSW received consult for Edinburgh Postnatal Depression Scale score of 10. CSW met with MOB to offer support and complete assessment. When CSW entered room, MOB was observed laying in hospital bed holding and bonding with infant. FOB and infant's maternal grandfather were present. CSW introduced self and requested to speak with MOB alone. FOB and MGF left room. CSW explained reason for consult. MOB presented as calm, was agreeable to consult and remained engaged throughout encounter.   CSW inquired about MOB's mental health history. MOB confirms that she experienced symptoms of postpartum depression and anxiety after the birth of her two other children, who are now 66 and 46 1/2 years old. MOB shared that she has a diagnosis of epilepsy and was diagnosed with depression and anxiety by her speciality provider who she sees for management of her seizures. MOB shares that after having her 5 1/33 year old, she felt "off" and like she "wasn't (her)self" for about 1 year. MOB shares this is the time she found out she was pregnant with infant, which was unexpected. MOB recalls feeling initially mad and upset and endorsed feelings of worry if she could parent a third child. MOB shares that there are times she feels her thoughts of worry take over. MOB shared how staying home with her children has been a difficult transition for her. CSW provided emotional support and validated MOB's feelings. MOB identified crafting as a healthy coping skill.   CSW inquired about mental health treatment. MOB shares that she has not taken psychotropic medication in the past due to concern about potential side effects. MOB shares that she did attend therapy while she was pregnant with her 44 1/33 year old and attended therapy sessions at her daughter's pediatrician's office on a few occasions postpartum, sharing she felt both supports were helpful. MOB was agreeable to outpatient therapy resources, which CSW provided. MOB shared that she has  good support, identifying FOB, her mother, father, and cousins as support. MOB shared that FOB will be taking 2-3 months off from work to help MOB adjust postpartum. MOB denied current SI/HI/DV.  CSW provided education regarding the baby blues period vs. perinatal mood disorders, discussed treatment and gave resources for mental health follow up if concerns arise. CSW recommends self-evaluation during the postpartum time period using the New Mom Checklist from Postpartum Progress and encouraged MOB to contact a medical professional if symptoms are noted at any time.    MOB reports she has all needed items for infant, including a car seat and bassinet. MOB has chosen [facility] for infant's follow up care.  CSW provided review of Sudden Infant Death Syndrome (SIDS) precautions.    CSW identifies no further need for intervention and no barriers to discharge at this time.  Signed,  Norberto Sorenson, MSW, LCSWA, LCASA 04/27/2023 5:10 PM

## 2023-04-27 NOTE — Discharge Summary (Signed)
Postpartum Discharge Summary  Date of Service updated 04/27/23      Patient Name: Karina Nelson DOB: 1991-05-08 MRN: [id number]  Date of admission: 04/26/2023 Delivery date:04/26/2023  Delivering provider: Pryor Ochoa [facility]  Date of discharge: 04/27/2023  Admitting diagnosis: Pregnancy [Z34.90] Intrauterine pregnancy: [redacted]w[redacted]d     Secondary diagnosis:  Principal Problem:   Pregnancy  Additional problems: Suspected macrosomia, epilepsy, anemia of pregnancy    Discharge diagnosis: Term Pregnancy Delivered and Anemia                                              Post partum procedures: none Augmentation: AROM and Pitocin Complications: None  Hospital course: Induction of Labor With Vaginal Delivery   32 y.o. yo G3P3003 at [redacted]w[redacted]d was admitted to the hospital 04/26/2023 for induction of labor.  Indication for induction:  suspected macrosomia .  Patient had an labor course complicated by nothing Membrane Rupture Time/Date: 9:35 AM ,04/26/2023   Delivery Method:Vaginal, Spontaneous  Episiotomy: None  Lacerations:  None  Details of delivery can be found in separate delivery note.  Patient had a postpartum course complicated by nothing. Patient is discharged home 04/27/23.  Newborn Data: Birth date:04/26/2023  Birth time:4:08 PM  Gender:Female  Living status:Living  Apgars:1 ,7  Weight:4280 g   Magnesium Sulfate received: No BMZ received: No Rhophylac:N/A MMR:N/A Transfusion:No  Physical exam  Vitals:   04/26/23 2020 04/27/23 0041 04/27/23 0453 04/27/23 0857  BP: 115/75 (!) 120/55 109/80 130/71  Pulse: (!) 103 (!) 125 (!) 106 93  Resp: 20 19 17 16   Temp: 98.1 F (36.7 C) 98.8 F (37.1 C) 98.3 F (36.8 C) 97.9 F (36.6 C)  TempSrc: Oral Oral Oral Oral  SpO2: 100% 98% 100% 99%  Weight:      Height:       General: alert, cooperative, and no distress Lochia: appropriate Uterine Fundus: firm Incision: N/A DVT Evaluation: No evidence of DVT seen on physical  exam. Labs: Lab Results  Component Value Date   WBC 14.4 (H) 04/27/2023   HGB 10.2 (L) 04/27/2023   HCT 30.8 (L) 04/27/2023   MCV 80.8 04/27/2023   PLT 158 04/27/2023      Latest Ref Rng & Units 10/28/2022    6:01 AM  CMP  Glucose 70 - 99 mg/dL 85   BUN 6 - 20 mg/dL 5   Creatinine 4.09 - 8.11 mg/dL 9.14   Sodium 782 - 956 mmol/L 136   Potassium 3.5 - 5.1 mmol/L 3.2   Chloride 98 - 111 mmol/L 107   CO2 22 - 32 mmol/L 23   Calcium 8.9 - 10.3 mg/dL 8.3    Edinburgh Score:    04/26/2023    6:20 PM  Edinburgh Postnatal Depression Scale Screening Tool  I have been able to laugh and see the funny side of things. 0  I have looked forward with enjoyment to things. 2  I have blamed myself unnecessarily when things went wrong. 2  I have been anxious or worried for no good reason. 3  I have felt scared or panicky for no good reason. 0  Things have been getting on top of me. 2  I have been so unhappy that I have had difficulty sleeping. 0  I have felt sad or miserable. 1  I have been so unhappy that I have been  crying. 0  The thought of harming myself has occurred to me. 0  Edinburgh Postnatal Depression Scale Total 10      After visit meds:  Allergies as of 04/27/2023       Reactions   Celery (apium Graveolens Var. Dulce) Skin Test Anaphylaxis   Rice Anaphylaxis   Strawberry (diagnostic) Anaphylaxis   Pineapple Itching   Sudafed [pseudoephedrine] Itching, Other (See Comments)   Dizziness  Weakness   Banana Itching        Medication List     TAKE these medications    acetaminophen 325 MG tablet Commonly known as: Tylenol Take 2 tablets (650 mg total) by mouth every 6 (six) hours as needed (for pain scale < 4). What changed:  medication strength how much to take reasons to take this   ibuprofen 600 MG tablet Commonly known as: ADVIL Take 1 tablet (600 mg total) by mouth every 6 (six) hours as needed.   lamoTRIgine 100 MG tablet Commonly known as:  LAMICTAL Take 50 mg by mouth 2 (two) times daily.   lamoTRIgine 200 MG tablet Commonly known as: LAMICTAL Take 2 tablets (400 mg total) by mouth 2 (two) times daily.         Discharge home in stable condition Infant Feeding: Breast Infant Disposition:home with mother Discharge instruction: per After Visit Summary and Postpartum booklet. Activity: Advance as tolerated. Pelvic rest for 6 weeks.  Diet: routine diet Anticipated Birth Control: Unsure Postpartum Appointment:6 weeks Additional Postpartum F/U:  none Future Appointments:No future appointments. Follow up Visit:  Follow-up Information     [facility]. Schedule an appointment as soon as possible for a visit in 6 week(s).   Contact information: [address]  [address] [place] [address] [phone]                     04/27/2023 Charlett Nose, MD

## 2023-05-07 ENCOUNTER — Telehealth (HOSPITAL_COMMUNITY): Payer: Self-pay

## 2023-05-07 NOTE — Telephone Encounter (Signed)
Patient reports feeling good, healing well, and adjusting. Patient declines questions/concerns about her health and healing.  Patient reports that baby is doing well. Patient reports baby is eating a lot. Patient has concerns about milk supply. RN reviewed supply and demand with patient. RN also reviewed outpatient lactation appointment information. Baby sleeps in a bassinet. RN reviewed ABC's of safe sleep with patient. Patient declines any questions or concerns about baby.  EPDS score is 14. Will notify Dr. Mindi Slicker of EPDS score via fax notification. RN told patient about Maternal Mental Health Resources ([facility], National Maternal Mental Health Hotline, [employer]partum [employer]). Also told patient about [facility] support group offerings. Will e-mail these resources to patient as well.  Suann [facility]  Perinatal Services   05/07/23,1843

## 2023-09-29 ENCOUNTER — Other Ambulatory Visit: Payer: Self-pay

## 2023-09-29 ENCOUNTER — Encounter (HOSPITAL_COMMUNITY): Payer: Self-pay

## 2023-09-29 ENCOUNTER — Emergency Department (HOSPITAL_COMMUNITY)
Admission: EM | Admit: 2023-09-29 | Discharge: 2023-09-29 | Disposition: A | Discharge status: Home / Self Care | Payer: Medicaid Other | Attending: Emergency Medicine | Admitting: Emergency Medicine

## 2023-09-29 DIAGNOSIS — R519 Headache, unspecified: Secondary | ICD-10-CM | POA: Diagnosis present

## 2023-09-29 DIAGNOSIS — R110 Nausea: Secondary | ICD-10-CM | POA: Insufficient documentation

## 2023-09-29 MED ORDER — MAGNESIUM SULFATE 2 GM/50ML IV SOLN
2.0000 g | Freq: Once | INTRAVENOUS | Status: AC
  Administered 2023-09-29: 2 g via INTRAVENOUS
  Filled 2023-09-29: qty 50

## 2023-09-29 MED ORDER — PROCHLORPERAZINE EDISYLATE 10 MG/2ML IJ SOLN
10.0000 mg | Freq: Once | INTRAMUSCULAR | Status: AC
  Administered 2023-09-29: 10 mg via INTRAVENOUS
  Filled 2023-09-29: qty 2

## 2023-09-29 MED ORDER — DIPHENHYDRAMINE HCL 50 MG/ML IJ SOLN
12.5000 mg | Freq: Once | INTRAMUSCULAR | Status: AC
  Administered 2023-09-29: 12.5 mg via INTRAVENOUS
  Filled 2023-09-29: qty 1

## 2023-09-29 MED ORDER — KETOROLAC TROMETHAMINE 15 MG/ML IJ SOLN
15.0000 mg | Freq: Once | INTRAMUSCULAR | Status: AC
  Administered 2023-09-29: 15 mg via INTRAVENOUS
  Filled 2023-09-29: qty 1

## 2023-09-29 MED ORDER — ONDANSETRON HCL 4 MG/2ML IJ SOLN
4.0000 mg | Freq: Once | INTRAMUSCULAR | Status: AC
  Administered 2023-09-29: 4 mg via INTRAVENOUS
  Filled 2023-09-29: qty 2

## 2023-09-29 NOTE — ED Triage Notes (Signed)
Pt came in via POV d/t HA that started 4 days ago feeling it behind her eyes, sleeping often, staying in bed d/t the pain. Endorses photosensitivity, cold chills, pressure to her forehead has gave some short lived relief, Tylenol has not helped. Has had n/v, explained it to feel like her brain is shifting from side to side. A/Ox4, rates her pain 9/10, Hx of seizures.

## 2023-09-29 NOTE — Discharge Instructions (Addendum)
He received medications in the emergency department.  Follow-up with your PCP.  For any concerning symptoms return to the emergency room.

## 2023-09-29 NOTE — ED Provider Notes (Signed)
[place] EMERGENCY DEPARTMENT AT [facility] Provider Note   CSN: [id number] Arrival date & time: 09/29/23  1608     History  Chief Complaint  Patient presents with   Migraine    Karina Nelson is a 32 y.o. female.  32 year old female presents today for concern of headache.  Headache is rated as severe and has been ongoing for the past 4 days.  Has progressively worsened.  This was not a sudden onset severe headache.  No vision change or balance issues.  No weakness or numbness.  She states she has history of seizures and this feels like the headaches she gets after her seizure but significantly worse.  Denies any definite history of migraine.  Has not taken any medications prior to arrival.  Endorses nausea and 1 episode of vomiting today.  The history is provided by the patient. No language interpreter was used.       Home Medications Prior to Admission medications   Medication Sig Start Date End Date Taking? Authorizing Provider  acetaminophen (TYLENOL) 325 MG tablet Take 2 tablets (650 mg total) by mouth every 6 (six) hours as needed (for pain scale < 4). 04/27/23   Charlett Nose, MD  ibuprofen (ADVIL) 600 MG tablet Take 1 tablet (600 mg total) by mouth every 6 (six) hours as needed. 04/27/23   Charlett Nose, MD  lamoTRIgine (LAMICTAL) 100 MG tablet Take 50 mg by mouth 2 (two) times daily.    [provider]  lamoTRIgine (LAMICTAL) 200 MG tablet Take 2 tablets (400 mg total) by mouth 2 (two) times daily. 10/28/22   Osvaldo Shipper, MD      Allergies    Celery (apium graveolens Cathie Beams) skin test, Rice, Strawberry (diagnostic), Pineapple, Sudafed [pseudoephedrine], and Banana    Review of Systems   Review of Systems  Constitutional:  Negative for chills and fever.  Eyes:  Positive for photophobia. Negative for visual disturbance.  Gastrointestinal:  Positive for nausea and vomiting.  Neurological:  Positive for headaches. Negative for  light-headedness.  All other systems reviewed and are negative.   Physical Exam Updated Vital Signs BP (!) 145/54   Pulse 76   Temp 98.5 F (36.9 C) (Oral)   Resp 16   Ht 5\' 5"  (1.651 m)   Wt 127 kg   SpO2 100%   BMI 46.59 kg/m  Physical Exam Vitals and nursing note reviewed.  Constitutional:      General: She is not in acute distress.    Appearance: Normal appearance. She is not ill-appearing.  HENT:     Head: Normocephalic and atraumatic.     Nose: Nose normal.  Eyes:     Conjunctiva/sclera: Conjunctivae normal.  Cardiovascular:     Rate and Rhythm: Normal rate and regular rhythm.     Heart sounds: Normal heart sounds.  Pulmonary:     Effort: Pulmonary effort is normal. No respiratory distress.     Breath sounds: Normal breath sounds. No wheezing.  Abdominal:     Palpations: Abdomen is soft.  Musculoskeletal:        General: No deformity. Normal range of motion.     Cervical back: Normal range of motion.  Skin:    Findings: No rash.  Neurological:     General: No focal deficit present.     Mental Status: She is alert and oriented to person, place, and time. Mental status is at baseline.     Cranial Nerves: No cranial nerve  deficit.     Sensory: No sensory deficit.     Motor: No weakness.     ED Results / Procedures / Treatments   Labs (all labs ordered are listed, but only abnormal results are displayed) Labs Reviewed - No data to display  EKG None  Radiology No results found.  Procedures Procedures    Medications Ordered in ED Medications  prochlorperazine (COMPAZINE) injection 10 mg (has no administration in time range)  magnesium sulfate IVPB 2 g 50 mL (has no administration in time range)  ketorolac (TORADOL) 15 MG/ML injection 15 mg (has no administration in time range)  diphenhydrAMINE (BENADRYL) injection 12.5 mg (has no administration in time range)  ondansetron (ZOFRAN) injection 4 mg (has no administration in time range)    ED  Course/ Medical Decision Making/ A&P                                 Medical Decision Making Risk Prescription drug management.   32 year old female presents today for concern of migraine headache.  Ongoing for the past 4 days and progressively and gradually worsening.  Neurological exam is reassuring.  Will provide migraine cocktail and reevaluate.  Significant improvement after the migraine cocktail.  Do not feel patient requires additional workup at this time.  She will follow-up with her PCP.  She is in agreement with plan.  She is stable for discharge.   Final Clinical Impression(s) / ED Diagnoses Final diagnoses:  Bad headache    Rx / DC Orders ED Discharge Orders     None         Marita Kansas, PA-C 09/29/23 2304    Rozelle Logan, DO 09/29/23 2339

## 2023-11-21 ENCOUNTER — Encounter: Payer: Self-pay | Admitting: Orthopedic Surgery

## 2023-11-21 ENCOUNTER — Ambulatory Visit: Payer: Medicaid Other | Admitting: Orthopedic Surgery

## 2023-11-21 ENCOUNTER — Other Ambulatory Visit: Payer: Self-pay

## 2023-11-21 VITALS — BP 121/90 | HR 123 | Ht 65.0 in | Wt 287.0 lb

## 2023-11-21 DIAGNOSIS — M545 Low back pain, unspecified: Secondary | ICD-10-CM | POA: Diagnosis not present

## 2023-11-21 MED ORDER — METHYLPREDNISOLONE 4 MG PO TBPK: ORAL_TABLET | ORAL | 0 refills | Status: DC

## 2023-11-21 MED ORDER — METHOCARBAMOL 500 MG PO TABS: 500.0000 mg | ORAL_TABLET | Freq: Three times a day (TID) | ORAL | 0 refills | Status: DC | PRN

## 2023-11-21 NOTE — Progress Notes (Signed)
Orthopedic Spine Surgery Office Note  Assessment: Patient is a 32 y.o. female with chronic, progressive low back pain.  No radicular symptoms   Plan: -Explained that initially conservative treatment is tried as a significant number of patients may experience relief with these treatment modalities. Discussed that the conservative treatments include:  -activity modification  -physical therapy  -over the counter pain medications  -medrol dosepak  -lumbar steroid injections -Patient has tried PT, tylenol -Recommended salonpas to be applied to her back.  Prescribed robaxin and medrol dose pak. Told her to use the medrol dose pak when she has severe pain. She should not take NSAIDs when using this medication -For her every day pain, she can use Tylenol up to 1000 mg 3 times per day -Recommended weight loss to help with her back pain and for her overall health.  She should also work on core strengthening -She would not be a candidate for elective spine surgery unless she were to get down to a BMI of 40 -Patient should return to office on as-needed basis   Patient expressed understanding of the plan and all questions were answered to the patient's satisfaction.   ___________________________________________________________________________   History:  Patient is a 32 y.o. female who presents today for lumbar spine.  Patient has had several years of mid lumbar back pain.  There is no trauma or injury that preceded the onset of pain.  She has previously tried physical therapy but did not find that helpful.  She said the pain has gotten worse recently as she has been lifting her newborn who weighs over 20 pounds.  She does not have any pain radiating to either lower extremity.  She feels the pain is worse when she is bending at the back to do something.   Weakness: Denies Symptoms of imbalance: Denies Paresthesias and numbness: Denies Bowel or bladder incontinence: Denies Saddle anesthesia:  Denies  Treatments tried: PT, tylenol  Review of systems: Denies fevers and chills, night sweats, unexplained weight loss, history of cancer.  Has had pain that wakes her at night  Past medical history: Anemia Depression Epilepsy Migraines  Allergies: pseudoephedrine  Past surgical history:  Left ear cyst removal  Social history: Denies use of nicotine product (smoking, vaping, patches, smokeless) Alcohol use: Rare Denies recreational drug use   Physical Exam:  BMI of 47.8  General: no acute distress, appears stated age Neurologic: alert, answering questions appropriately, following commands Respiratory: unlabored breathing on room air, symmetric chest rise Psychiatric: appropriate affect, normal cadence to speech   MSK (spine):  -Strength exam      Left  Right EHL    5/5  5/5 TA    5/5  5/5 GSC    5/5  5/5 Knee extension  5/5  5/5 Hip flexion   5/5  5/5  -Sensory exam    Sensation intact to light touch in L3-S1 nerve distributions of bilateral lower extremities  -Achilles DTR: 1/4 on the left, 2/4 on the right -Patellar tendon DTR: 2/4 on the left, 2/4 on the right  -Straight leg raise: Negative bilaterally -Femoral nerve stretch test: Negative bilaterally -Clonus: no beats bilaterally  -Left hip exam: No pain through range of motion, negative Stinchfield, negative FABER -Right hip exam: No pain through range of motion, negative Stinchfield, negative FABER  Imaging: XRs of the lumbar spine from 11/21/2023 were independently reviewed and interpreted, showing disc height loss at L4/5.  No evidence of instability on flexion/extension views.  No other changes seen.  No fracture or dislocation seen.   MRI of the lumbar spine from 05/10/2018 was independently reviewed and interpreted, showing early DDD with modic changes at L4/5. No significant stenosis seen. Facet arthropathy at L5/S1.    Patient name: Karina Nelson Patient MRN: [id number] Date of visit:  11/21/23

## 2024-11-02 ENCOUNTER — Encounter: Payer: Self-pay | Admitting: Radiology

## 2024-11-09 ENCOUNTER — Ambulatory Visit: Admitting: Obstetrics and Gynecology

## 2024-11-09 ENCOUNTER — Other Ambulatory Visit: Payer: Self-pay

## 2024-11-09 ENCOUNTER — Encounter: Payer: Self-pay | Admitting: Obstetrics and Gynecology

## 2024-11-09 VITALS — BP 121/86 | HR 92 | Wt 272.8 lb

## 2024-11-09 DIAGNOSIS — Z3009 Encounter for other general counseling and advice on contraception: Secondary | ICD-10-CM

## 2024-11-09 NOTE — Progress Notes (Unsigned)
   GYNECOLOGY OFFICE NOTE  History:  33 y.o. [id number] here today for tubal sterilization consult.    Patient has epilepsy and has been stable on lamictal  for years, changed dose to 900 mg daily. Last seizure 2023 and was due to missing doses during pregnancy.  Past Medical History:  Diagnosis Date   Anemia    Anhedonia    Asthma    Depression    Epilepsy (HCC)    09/2022 last seizure   migraines without aura    Scoliosis    Seasonal allergies    SVD (spontaneous vaginal delivery) 05/07/2017    Past Surgical History:  Procedure Laterality Date   OTHER SURGICAL HISTORY     cyst removal behind left ear     Current Outpatient Medications:    lamoTRIgine  (LAMICTAL ) 200 MG tablet, Take 2 tablets (400 mg total) by mouth 2 (two) times daily., Disp: 120 tablet, Rfl: 1   lamoTRIgine  (LAMICTAL ) 200 MG tablet, Take 900 mg by mouth daily., Disp: , Rfl:    phentermine 15 MG capsule, Take 15 mg by mouth daily., Disp: , Rfl:   The following portions of the patient's history were reviewed and updated as appropriate: allergies, current medications, past family history, past medical history, past social history, past surgical history and problem list.   Review of Systems:  Pertinent items noted in HPI and remainder of comprehensive ROS otherwise negative.   Objective:  Physical Exam BP 121/86   Pulse 92   Wt 272 lb 12.8 oz (123.7 kg)   BMI 45.40 kg/m  CONSTITUTIONAL: Well-developed, well-nourished female in no acute distress.  HENT:  Normocephalic, atraumatic. External right and left ear normal. Oropharynx is clear and moist EYES: Conjunctivae and EOM are normal. Pupils are equal, round, and reactive to light. No scleral icterus.  NECK: Normal range of motion, supple, no masses SKIN: Skin is warm and dry. No rash noted. Not diaphoretic. No erythema. No pallor. NEUROLOGIC: Alert and oriented to person, place, and time. Normal reflexes, muscle tone coordination. No cranial nerve deficit  noted. PSYCHIATRIC: Normal mood and affect. Normal behavior. Normal judgment and thought content. CARDIOVASCULAR: Normal heart rate noted RESPIRATORY: Effort normal, no problems with respiration noted ABDOMEN: Soft, no distention noted.   PELVIC: deferred MUSCULOSKELETAL: Normal range of motion. No edema noted.   Labs and Imaging No results found.  Assessment & Plan:  1. Unwanted fertility (Primary) -Patient counseled regarding permanency of procedure, and she will not be able to have children after it is done.  -Reviewed risks of surgery including infection, hemorrhage, damage to surrounding tissue and organs, risk of regret - Reviewed that bilateral tubal ligation is not 100% effective and she will still have periods, and benefit from hormonal cycling with ovaries -Reviewed slightly increased risk of ectopic pregnancy -Reviewed methods for sterilization including salpingectomy and decreased risk for ovarian cancer with removal of tubes - reviewed she will be scheduled with partner for surgery and pre-surgical consult as well -She verbalizes understanding of all of the above - state sterilization papers signed   Routine preventative health maintenance measures emphasized. Please refer to After Visit Summary for other counseling recommendations.   Return for with Dr. Jeralyn.   LOIS Yolanda Moats, MD, [facility] Attending Center for [employer] [facility])

## 2024-11-12 ENCOUNTER — Encounter: Payer: Self-pay | Admitting: Obstetrics and Gynecology

## 2024-12-10 ENCOUNTER — Ambulatory Visit: Admitting: Obstetrics and Gynecology

## 2025-01-06 ENCOUNTER — Emergency Department (HOSPITAL_COMMUNITY)

## 2025-01-06 ENCOUNTER — Other Ambulatory Visit: Payer: Self-pay

## 2025-01-06 ENCOUNTER — Observation Stay (HOSPITAL_COMMUNITY)

## 2025-01-06 ENCOUNTER — Observation Stay (HOSPITAL_COMMUNITY)
Admission: EM | Admit: 2025-01-06 | Discharge: 2025-01-08 | Disposition: A | Discharge status: Home / Self Care | Attending: Emergency Medicine | Admitting: Emergency Medicine

## 2025-01-06 DIAGNOSIS — G43509 Persistent migraine aura without cerebral infarction, not intractable, without status migrainosus: Secondary | ICD-10-CM | POA: Diagnosis not present

## 2025-01-06 DIAGNOSIS — R2990 Unspecified symptoms and signs involving the nervous system: Secondary | ICD-10-CM | POA: Diagnosis not present

## 2025-01-06 DIAGNOSIS — E876 Hypokalemia: Secondary | ICD-10-CM | POA: Diagnosis not present

## 2025-01-06 DIAGNOSIS — G959 Disease of spinal cord, unspecified: Secondary | ICD-10-CM

## 2025-01-06 DIAGNOSIS — G40909 Epilepsy, unspecified, not intractable, without status epilepticus: Secondary | ICD-10-CM | POA: Diagnosis not present

## 2025-01-06 DIAGNOSIS — G43511 Persistent migraine aura without cerebral infarction, intractable, with status migrainosus: Secondary | ICD-10-CM

## 2025-01-06 DIAGNOSIS — J309 Allergic rhinitis, unspecified: Secondary | ICD-10-CM | POA: Diagnosis present

## 2025-01-06 DIAGNOSIS — J45909 Unspecified asthma, uncomplicated: Secondary | ICD-10-CM | POA: Insufficient documentation

## 2025-01-06 DIAGNOSIS — I639 Cerebral infarction, unspecified: Secondary | ICD-10-CM

## 2025-01-06 DIAGNOSIS — Z87891 Personal history of nicotine dependence: Secondary | ICD-10-CM | POA: Diagnosis not present

## 2025-01-06 DIAGNOSIS — R29703 NIHSS score 3: Secondary | ICD-10-CM

## 2025-01-06 DIAGNOSIS — R531 Weakness: Principal | ICD-10-CM

## 2025-01-06 DIAGNOSIS — Z79899 Other long term (current) drug therapy: Secondary | ICD-10-CM | POA: Diagnosis not present

## 2025-01-06 DIAGNOSIS — J301 Allergic rhinitis due to pollen: Secondary | ICD-10-CM

## 2025-01-06 DIAGNOSIS — R29818 Other symptoms and signs involving the nervous system: Secondary | ICD-10-CM | POA: Diagnosis present

## 2025-01-06 LAB — COMPREHENSIVE METABOLIC PANEL WITH GFR
ALT: 16 U/L (ref 0–44)
AST: 18 U/L (ref 15–41)
Albumin: 4.1 g/dL (ref 3.5–5.0)
Alkaline Phosphatase: 101 U/L (ref 38–126)
Anion gap: 12 (ref 5–15)
BUN: <5 mg/dL — ABNORMAL LOW (ref 6–20)
CO2: 24 mmol/L (ref 22–32)
Calcium: 8.6 mg/dL — ABNORMAL LOW (ref 8.9–10.3)
Chloride: 100 mmol/L (ref 98–111)
Creatinine, Ser: 0.66 mg/dL (ref 0.44–1.00)
GFR, Estimated: >60 mL/min
Glucose, Bld: 138 mg/dL — ABNORMAL HIGH (ref 70–99)
Potassium: 3.3 mmol/L — ABNORMAL LOW (ref 3.5–5.1)
Sodium: 136 mmol/L (ref 135–145)
Total Bilirubin: 0.6 mg/dL (ref 0.0–1.2)
Total Protein: 7.5 g/dL (ref 6.5–8.1)

## 2025-01-06 LAB — DIFFERENTIAL
Abs Immature Granulocytes: 0.03 K/uL (ref 0.00–0.07)
Basophils Absolute: 0 K/uL (ref 0.0–0.1)
Basophils Relative: 0 %
Eosinophils Absolute: 0 K/uL (ref 0.0–0.5)
Eosinophils Relative: 0 %
Immature Granulocytes: 1 %
Lymphocytes Relative: 33 %
Lymphs Abs: 2.2 K/uL (ref 0.7–4.0)
Monocytes Absolute: 0.3 K/uL (ref 0.1–1.0)
Monocytes Relative: 5 %
Neutro Abs: 4 K/uL (ref 1.7–7.7)
Neutrophils Relative %: 61 %

## 2025-01-06 LAB — CBC
HCT: 36.4 % (ref 36.0–46.0)
Hemoglobin: 12.5 g/dL (ref 12.0–15.0)
MCH: 31.6 pg (ref 26.0–34.0)
MCHC: 34.3 g/dL (ref 30.0–36.0)
MCV: 92.2 fL (ref 80.0–100.0)
Platelets: 184 K/uL (ref 150–400)
RBC: 3.95 MIL/uL (ref 3.87–5.11)
RDW: 14.6 % (ref 11.5–15.5)
WBC: 6.5 K/uL (ref 4.0–10.5)
nRBC: 0 % (ref 0.0–0.2)

## 2025-01-06 LAB — PROTIME-INR
INR: 1 (ref 0.8–1.2)
Prothrombin Time: 14.3 s (ref 11.4–15.2)

## 2025-01-06 LAB — APTT: aPTT: 25 s (ref 24–36)

## 2025-01-06 LAB — ETHANOL: Alcohol, Ethyl (B): <15 mg/dL

## 2025-01-06 MED ORDER — MELATONIN 3 MG PO TABS: 3.0000 mg | ORAL_TABLET | Freq: Every evening | ORAL | Status: DC | PRN

## 2025-01-06 MED ORDER — IOHEXOL 350 MG/ML SOLN
100.0000 mL | Freq: Once | INTRAVENOUS | Status: AC | PRN
  Administered 2025-01-06: 100 mL via INTRAVENOUS

## 2025-01-06 MED ORDER — ACETAMINOPHEN 650 MG RE SUPP: 650.0000 mg | Freq: Four times a day (QID) | RECTAL | Status: DC | PRN

## 2025-01-06 MED ORDER — POTASSIUM CHLORIDE 20 MEQ PO PACK
40.0000 meq | PACK | Freq: Once | ORAL | Status: DC
  Filled 2025-01-06: qty 2

## 2025-01-06 MED ORDER — ONDANSETRON HCL 4 MG/2ML IJ SOLN: 4.0000 mg | Freq: Four times a day (QID) | INTRAMUSCULAR | Status: DC | PRN

## 2025-01-06 MED ORDER — SODIUM CHLORIDE 0.9% FLUSH: 3.0000 mL | Freq: Once | INTRAVENOUS | Status: DC

## 2025-01-06 MED ORDER — ACETAMINOPHEN 325 MG PO TABS
650.0000 mg | ORAL_TABLET | Freq: Four times a day (QID) | ORAL | Status: DC | PRN
  Administered 2025-01-08: 650 mg via ORAL
  Filled 2025-01-06: qty 2

## 2025-01-06 NOTE — Consult Note (Addendum)
 NEUROLOGY CONSULT NOTE   Date of service: January 06, 2025 Patient Name: Karina Nelson MRN:  [id number] DOB:  Aug 22, 1991 Chief Complaint: Headache, nausea, vomiting, right-sided weakness, slurred speech Requesting Provider: Lenor Hollering, MD  History of Present Illness  Karina Nelson is a 34 y.o. female with hx of seizure disorder on Lamictal , depression, migraines, presented for evaluation of headache, nausea and difficulty walking because of right-sided weakness.  Last seen normal by family around 11 AM, went to take a nap and when she woke up around noon, she had a headache and had slurred speech along with right-sided weakness.  Weakness was reported both in the upper extremity as well as difficulty walking due to weakness in both legs worse on the right.  Also reports a headache.  And nausea.  Had 2 episodes of vomiting on the way to the hospital.  Initially she reported no seizures but later on mother arrived and reported that husband thinks she might have had a seizure.  Last seizure was 2023.  She is followed by neurologist Dr. Kendall at [facility], with last office visit in September 2025 when her lamotrigine  was changed to once daily dosing due to noncompliance with twice daily dosing.  LKW: 11 AM Modified rankin score: 0-Completely asymptomatic and back to baseline post- stroke IV Thrombolysis: Outside the window EVT: No ELVO  NIHSS components Score: Comment  1a Level of Conscious 0[x]  1[]  2[]  3[]      1b LOC Questions 0[x]  1[]  2[]       1c LOC Commands 0[x]  1[]  2[]       2 Best Gaze 0[x]  1[]  2[]       3 Visual 0[x]  1[]  2[]  3[]      4 Facial Palsy 0[x]  1[]  2[]  3[]      5a Motor Arm - left 0[x]  1[]  2[]  3[]  4[]  UN[]    5b Motor Arm - Right 0[x]  1[]  2[]  3[]  4[]  UN[]    6a Motor Leg - Left 0[]  1[x]  2[]  3[]  4[]  UN[]    6b Motor Leg - Right 0[]  1[x]  2[]  3[]  4[]  UN[]    7 Limb Ataxia 0[x]  1[]  2[]  UN[]      8 Sensory 0[]  1[x]  2[]  UN[]      9 Best Language 0[x]  1[]  2[]  3[]      10 Dysarthria 0[x]   1[]  2[]  UN[]      11 Extinct. and Inattention 0[x]  1[]  2[]       TOTAL: 3      ROS  Comprehensive ROS performed and pertinent positives documented in HPI   Past History   Past Medical History:  Diagnosis Date   Anemia    Anhedonia    Asthma    Depression    Epilepsy (HCC)    09/2022 last seizure   migraines without aura    Scoliosis    Seasonal allergies    SVD (spontaneous vaginal delivery) 05/07/2017    Past Surgical History:  Procedure Laterality Date   OTHER SURGICAL HISTORY     cyst removal behind left ear    Family History: Family History  Problem Relation Age of Onset   Hypertension Mother    Cancer Father    Heart disease Father    Diabetes Paternal Grandfather    Cancer Paternal Aunt     Social History  reports that she has quit smoking. Her smoking use included cigars. She has never used smokeless tobacco. She reports that she does not currently use alcohol. She reports that she does not currently use drugs after having used the following  drugs: Marijuana.  Allergies[1]  Medications  Current Medications[2]  Vitals   There were no vitals filed for this visit.  There is no height or weight on file to calculate BMI.   Physical Exam  General: Awake alert HEENT: Normocephalic atraumatic Chest:Clear Cardiovascular: Regular rhythm Abdomen nondistended nontender Neurological exam She is awake alert oriented x 3 but has poor attention concentration No dysarthria No evidence of aphasia Cranial nerves II to XII intact Motor examination with no drift in the upper extremities.  Bilateral lower extremities have mild drift. Sensation is somewhat diminished on the right side  Labs/Imaging/Neurodiagnostic studies   CBC:  Recent Labs  Lab 01/18/25 1942  WBC 6.5  NEUTROABS 4.0  HGB 12.5  HCT 36.4  MCV 92.2  PLT 184   Basic Metabolic Panel:  Lab Results  Component Value Date   NA 136 10/28/2022   K 3.2 (L) 10/28/2022   CO2 23 10/28/2022    GLUCOSE 85 10/28/2022   BUN 5 (L) 10/28/2022   CREATININE 0.56 10/28/2022   CALCIUM 8.3 (L) 10/28/2022   GFRNONAA >60 10/28/2022   GFRAA >60 06/06/2020   Urine Drug Screen:     Component Value Date/Time   LABOPIA NONE DETECTED 10/14/2007 0905   COCAINSCRNUR NONE DETECTED 10/14/2007 0905   LABBENZ NONE DETECTED 10/14/2007 0905   AMPHETMU POSITIVE (A) 10/14/2007 0905   THCU NONE DETECTED 10/14/2007 0905   LABBARB  10/14/2007 0905    NONE DETECTED        DRUG SCREEN FOR MEDICAL PURPOSES ONLY.  IF CONFIRMATION IS NEEDED FOR ANY PURPOSE, NOTIFY LAB WITHIN 5 DAYS.     CT Head without contrast(Personally reviewed): No acute intracranial abnormality  CT angio Head and Neck with contrast(Personally reviewed): No LVO.  No hemodynamically significant stenosis.  No evidence of penumbra or core infarct by CT perfusion.  ASSESSMENT   Karina Nelson is a 34 y.o. female past history of seizure disorder on Lamictal  with unclear compliance to medications, depression, migraines presented for evaluation of headache, nausea and difficulty walking because of right worse than left leg weakness.  Patient reported no seizure but family reports she probably had a seizure this afternoon.  For EMS, she had difficulty walking due to the leg weakness.  She also was nauseated and had 2 episodes of vomiting on the way to the hospital. On examination, she has weakness in her bilateral lower extremity and some sensory deficit on the right side but otherwise an unremarkable exam.  She does have diminished attention concentration. At this time wide differentials that include postictal state versus complex migraine versus toxidrome.  The leg weakness though is somewhat concerning and due to the fact that initial report was of upper extremity weakness as well, I would favor imaging her brain and C-spine.  Impression: Evaluate for seizure Evaluate for stroke Evaluate for myelopathy  RECOMMENDATIONS  Stat MRI brain  and C-spine ordered Lamotrigine  levels EEG in the morning. Resume home lamotrigine   Will follow the above and provide further recommendations Initial plan discussed with Dr. Lenor in person. Plan also discussed with patient and her mother at bedside.  MRI brain and C-spine still pending as of 10:27 PM. Will follow when available.  ______________________________________________________________________    Signed, Eligio Lav, MD [facility]     [1]  Allergies Allergen Reactions   Apium Graveolens Var. Dulce Anaphylaxis   Rice Anaphylaxis   Strawberry (Diagnostic) Anaphylaxis   Pineapple Itching   Sudafed [Pseudoephedrine] Itching and Other (See Comments)  Dizziness  Weakness   Banana Itching  [2]  Current Facility-Administered Medications:    sodium chloride  flush (NS) 0.9 % injection 3 mL, 3 mL, Intravenous, Once, Lenor Hollering, MD  Current Outpatient Medications:    lamoTRIgine  (LAMICTAL ) 200 MG tablet, Take 2 tablets (400 mg total) by mouth 2 (two) times daily., Disp: 120 tablet, Rfl: 1   lamoTRIgine  (LAMICTAL ) 200 MG tablet, Take 900 mg by mouth daily., Disp: , Rfl:    phentermine 15 MG capsule, Take 15 mg by mouth daily., Disp: , Rfl:

## 2025-01-06 NOTE — ED Provider Notes (Signed)
 " [place] EMERGENCY DEPARTMENT AT [facility] Provider Note   CSN: [id number] Arrival date & time: 01/06/25  1936  An emergency department physician performed an initial assessment on this suspected stroke patient at 34.  Patient presents with: Code Stroke   Karina Nelson is a 34 y.o. female.   Patient is a 34 year old female who presents as a code stroke.  She has a history of seizures.  She thinks she may have had a seizure today.  She said she was feeling fine this morning.  She started feeling dizzy around 1 PM and went to lay down.  She woke up later this afternoon and her husband had noted that she was having trouble walking.  She feels weak all over but was having increased weakness on the left side and increased numbness on the left side.  She was having difficulty with her speech.  For this reason a code stroke was activated.  She has not had a seizure in a while.  She is compliant with her medication.  She denies any fevers or other recent illnesses.       Prior to Admission medications  Medication Sig Start Date End Date Taking? Authorizing Provider  LamoTRIgine  300 MG TB24 24 hour tablet Take 3 tablets by mouth daily. 12/26/24  Yes [provider]  lamoTRIgine  (LAMICTAL ) 200 MG tablet Take 2 tablets (400 mg total) by mouth 2 (two) times daily. 10/28/22   Krishnan, Gokul, MD  lamoTRIgine  (LAMICTAL ) 200 MG tablet Take 900 mg by mouth daily.    [provider]  phentermine 15 MG capsule Take 15 mg by mouth daily. 10/05/24   [provider]    Allergies: Apium graveolens var. dulce, Rice, Strawberry (diagnostic), Pineapple, Sudafed [pseudoephedrine], and Banana    Review of Systems  Constitutional:  Positive for fatigue. Negative for chills, diaphoresis and fever.  HENT:  Negative for congestion, rhinorrhea and sneezing.   Eyes: Negative.   Respiratory:  Negative for cough, chest tightness and shortness of breath.   Cardiovascular:   Negative for chest pain and leg swelling.  Gastrointestinal:  Negative for abdominal pain, diarrhea, nausea and vomiting.  Genitourinary:  Negative for difficulty urinating, flank pain and frequency.  Musculoskeletal:  Negative for arthralgias and back pain.  Skin:  Negative for rash.  Neurological:  Positive for dizziness, speech difficulty, weakness, numbness and headaches.    Updated Vital Signs BP (!) 128/98   Pulse 86   Resp 12   Ht 5' 4 (1.626 m)   Wt 122 kg   SpO2 100%   BMI 46.17 kg/m   Physical Exam Constitutional:      Appearance: She is well-developed.  HENT:     Head: Normocephalic and atraumatic.  Eyes:     Pupils: Pupils are equal, round, and reactive to light.  Cardiovascular:     Rate and Rhythm: Normal rate and regular rhythm.     Heart sounds: Normal heart sounds.  Pulmonary:     Effort: Pulmonary effort is normal. No respiratory distress.     Breath sounds: Normal breath sounds. No wheezing or rales.  Chest:     Chest wall: No tenderness.  Abdominal:     General: Bowel sounds are normal.     Palpations: Abdomen is soft.     Tenderness: There is no abdominal tenderness. There is no guarding or rebound.  Musculoskeletal:        General: Normal range of motion.     Cervical  back: Normal range of motion and neck supple.  Lymphadenopathy:     Cervical: No cervical adenopathy.  Skin:    General: Skin is warm and dry.     Findings: No rash.  Neurological:     Mental Status: She is alert and oriented to person, place, and time.     Comments: Motor 5/5 all extremities Sensation grossly intact to LT all extremities Finger to Nose intact, no pronator drift CN II-XII grossly intact       (all labs ordered are listed, but only abnormal results are displayed) Labs Reviewed  COMPREHENSIVE METABOLIC PANEL WITH GFR - Abnormal; Notable for the following components:      Result Value   Potassium 3.3 (*)    Glucose, Bld 138 (*)    BUN <5 (*)    Calcium  8.6 (*)    All other components within normal limits  PROTIME-INR  APTT  CBC  DIFFERENTIAL  ETHANOL  HCG, SERUM, QUALITATIVE  URINE DRUG SCREEN  LAMOTRIGINE  LEVEL  I-STAT CHEM 8, ED  CBG MONITORING, ED  I-STAT CHEM 8, ED    EKG: EKG Interpretation Date/Time:  Wednesday January 06 2025 21:31:17 EST Ventricular Rate:  87 PR Interval:  167 QRS Duration:  106 QT Interval:  372 QTC Calculation: 448 R Axis:   42  Text Interpretation: Sinus rhythm Low voltage, precordial leads since last tracing no significant change Confirmed by Lenor Hollering [phone]) on 01/06/2025 9:37:27 PM  Radiology: CT ANGIO HEAD NECK W WO CM W PERF (CODE STROKE) Result Date: 01/06/2025 EXAM: CTA Head and Neck with Perfusion 01/06/2025 08:03:02 PM TECHNIQUE: CTA of the head and neck was performed with the administration of intravenous contrast. 3D postprocessing with multiplanar reconstructions and MIPs was performed to evaluate the vascular anatomy. Cerebral perfusion analysis using computed tomography with contrast administration, including post-processing of parametric maps with determination of cerebral blood flow, cerebral blood volume, mean transit time and time-to-maximum. Automated exposure control, iterative reconstruction, and/or weight based adjustment of the mA/kV was utilized to reduce the radiation dose to as low as reasonably achievable. COMPARISON: None available CLINICAL HISTORY: Neuro deficit, acute, stroke suspected Neuro deficit, acute, stroke suspected FINDINGS: AORTIC ARCH AND ARCH VESSELS: No dissection or arterial injury. No significant stenosis of the brachiocephalic or subclavian arteries. CERVICAL CAROTID ARTERIES: No dissection, arterial injury, or hemodynamically significant stenosis by NASCET criteria. CERVICAL VERTEBRAL ARTERIES: No dissection, arterial injury, or significant stenosis. LUNGS AND MEDIASTINUM: Unremarkable. SOFT TISSUES: No acute abnormality. BONES: No acute abnormality. ANTERIOR  CIRCULATION: No significant stenosis of the internal carotid arteries. No significant stenosis of the anterior cerebral arteries. No significant stenosis of the middle cerebral arteries. No aneurysm. POSTERIOR CIRCULATION: No significant stenosis of the posterior cerebral arteries. No significant stenosis of the basilar artery. No significant stenosis of the vertebral arteries. No aneurysm. OTHER: No dural venous sinus thrombosis on this non-dedicated study. EXAM QUALITY: Exam quality is adequate with diagnostic perfusion maps. No significant motion artifact. Appropriate arterial inflow and venous outflow curves. CORE INFARCT (CBF<30% volume): 0 mL TOTAL HYPOPERFUSION (Tmax>6s volume): 0 mL Mismatch volume: 0 mL Mismatch ratio: not applicable Location: not applicable IMPRESSION: 1. No large vessel occlusion. 2. No hemodynamically significant stenosis. 3. No evidence of penumbra or core infarct by CT perfusion. Electronically signed by: Gilmore Molt MD 01/06/2025 08:19 PM EST RP Workstation: [id number]   CT HEAD CODE STROKE WO CONTRAST Result Date: 01/06/2025 EXAM: CT HEAD WITHOUT CONTRAST 01/06/2025 07:48:57 PM TECHNIQUE: CT of the head was  performed without the administration of intravenous contrast. Automated exposure control, iterative reconstruction, and/or weight based adjustment of the mA/kV was utilized to reduce the radiation dose to as low as reasonably achievable. COMPARISON: None available. CLINICAL HISTORY: Neuro deficit, acute, stroke suspected FINDINGS: BRAIN AND VENTRICLES: No acute hemorrhage. No evidence of acute infarct. No hydrocephalus. No extra-axial collection. No mass effect or midline shift. ORBITS: No acute abnormality. SINUSES: No acute abnormality. SOFT TISSUES AND SKULL: No acute soft tissue abnormality. No skull fracture. Findings communicated via pager to Dr. Arora at 8:00 PM IMPRESSION: 1. No acute intracranial abnormality.  ASPECTS 10. Electronically signed by: Gilmore Molt MD  01/06/2025 08:00 PM EST RP Workstation: [id number]     Procedures   Medications Ordered in the ED  sodium chloride  flush (NS) 0.9 % injection 3 mL (3 mLs Intravenous Not Given 01/06/25 2020)  iohexol  (OMNIPAQUE ) 350 MG/ML injection 100 mL (100 mLs Intravenous Contrast Given 01/06/25 1957)                                    Medical Decision Making Amount and/or Complexity of Data Reviewed Labs: ordered. Radiology: ordered.  Risk Decision regarding hospitalization.   This patient presents to the ED for concern of dizziness, left-sided weakness, this involves an extensive number of treatment options, and is a complaint that carries with it a high risk of complications and morbidity.  I considered the following differential and admission for this acute, potentially life threatening condition.  The differential diagnosis includes stroke, seizure, complex migraine, meningitis, intracranial hemorrhage, brain mass  MDM:    Patient is a 34 year old with a history of seizures who presents as a code stroke.  On my exam, her symptoms have essentially resolved or are significantly improving.  Head CT does not show any intracranial hemorrhage.  CTA does not show any evidence of dissection or large vessel occlusion, ethanol level is negative.  Electrolytes show mildly low potassium but otherwise nonconcerning.  CBC shows a normal hemoglobin.  Dr. Arora with neurology has seen the patient and ordered an MRI of the brain and cervical spine.  He recommends overnight admission for observation and EEG in the morning.  Will consult the medicine team.  Discussed with Dr. Marcene who will admit the patient.  (Labs, imaging, consults)  Labs: I Ordered, and personally interpreted labs.  The pertinent results include: Normal hemoglobin, electrolytes nonconcerning, EtOH negative  Imaging Studies ordered: I ordered imaging studies including CT head, CTA head and neck I independently visualized and interpreted  imaging. I agree with the radiologist interpretation  Additional history obtained from family at bedside.  External records from outside source obtained and reviewed including prior notes  Cardiac Monitoring: The patient was maintained on a cardiac monitor.  If on the cardiac monitor, I personally viewed and interpreted the cardiac monitored which showed an underlying rhythm of: Sinus rhythm  Reevaluation: After the interventions noted above, I reevaluated the patient and found that they have :improved  Social Determinants of Health:    Disposition: Admit to hospital  Co morbidities that complicate the patient evaluation  Past Medical History:  Diagnosis Date   Anemia    Anhedonia    Asthma    Depression    Epilepsy (HCC)    09/2022 last seizure   migraines without aura    Scoliosis    Seasonal allergies    SVD (spontaneous vaginal delivery) 05/07/2017  Medicines Meds ordered this encounter  Medications   sodium chloride  flush (NS) 0.9 % injection 3 mL   iohexol  (OMNIPAQUE ) 350 MG/ML injection 100 mL    I have reviewed the patients home medicines and have made adjustments as needed  Problem List / ED Course: Problem List Items Addressed This Visit   None Visit Diagnoses       Left-sided weakness    -  Primary                Final diagnoses:  Left-sided weakness    ED Discharge Orders     None          Lenor Hollering, MD 01/06/25 2138  "

## 2025-01-06 NOTE — H&P (Signed)
 " History and Physical      Elmina E Farewell FMW:[id number] DOB: June 20, 1991 DOA: 01/06/2025; DOS: 01/06/2025  PCP: [facility]  Patient coming from: home   I have personally briefly reviewed patient's old medical records in [facility]  Chief Complaint: Episode of left-sided weakness  HPI: Megin E Domanski is a 34 y.o. female with medical history significant for seizures, who is admitted to [facility] on 01/06/2025 with stroke like symptoms  after presenting from home to [facility] ED complaining of left-sided weakness.   The patient was in her normal state of health leading up to 1300 on 01/06/2025 at which time she developed some mild dizziness, numbness/paresthesias.  At that time, she 1500, noted interval involvement of left-sided weakness as well as left-sided numbness, which have subsequently completely resolved.  Patient's mother, who is present at bedside, is also able to provide additional history, does not 25, patient's was reduced by half which she takes her Lipitor for a history of seizure disorder.  Patient has been taking the phentermine loss medication, has been sleeping less, and has been experiencing increased stress levels due to family obligations, including taking care of her multiple children at no history of alcohol abuse or recent alcohol consumption.  No history of recreational drug abuse.  No recent chest pain or shortness of breath.    ED Course:  Vital signs in the ED were notable for the following: Afebrile; in the 80s to 100s; still blood pressures in the 120s to 130s; respiratory rate 17-21, oxygen saturation 100% on room air.  Labs were notable for the following: CMP notable for sodium 136, potassium 3.3, bicarbonate 24, creatinine 0.66, glucose 130, liver enzymes within normal limits.  CBC notable for white cell count 6500.  History is pan negative.  INR 1.0, serum ethanol level is less than 15.  Serum Lamictal  level has been ordered, with result  currently pending.  Per my interpretation, EKG in ED demonstrated the following: EKG showed sinus rhythm with heart rate 87, normal intervals, no evidence of T wave or ST changes, including no evidence of ST ovation.  Imaging in the ED, per corresponding formal radiology read, was notable for the following: Noncontrast CTh showed evidence of acute intracranial process, Cleen evidence of intra hemorrhage.  Very evidence of acute infarct.  CTA head and neck showed no evidence of large vessel occlusion.  EDP d/w on-call neurology, Dr. Arora, who is formally consulting. Dr. Voncile suspects that the presentation is more likely on basis of a breakthrough seizure as opposed to an acute ischemic stroke. He recommends MRI brain, MRI cervical spine, EEG, lamictal  level, and did not recommend loading the patient with anti-epileptic at this time.   Right cervical spine imaging ordered, with results currently pending.  While in the ED, the following were administered: Administered in the ED this evening.  Subsequently, the patient was admitted for further evaluation management of strokelike symptoms, with seizure, with presenting labs also notable for hypokalemia.     Review of Systems: As per HPI otherwise 10 point review of systems negative.   Past Medical History:  Diagnosis Date   Anemia    Anhedonia    Asthma    Depression    Epilepsy (HCC)    09/2022 last seizure   migraines without aura    Scoliosis    Seasonal allergies    SVD (spontaneous vaginal delivery) 05/07/2017    Past Surgical History:  Procedure Laterality Date  OTHER SURGICAL HISTORY     cyst removal behind left ear    Social History:  reports that she has quit smoking. Her smoking use included cigars. She has never used smokeless tobacco. She reports that she does not currently use alcohol. She reports that she does not currently use drugs after having used the following drugs: Marijuana.   Allergies[1]  Family  History  Problem Relation Age of Onset   Hypertension Mother    Cancer Father    Heart disease Father    Diabetes Paternal Grandfather    Cancer Paternal Aunt     Family history reviewed and not pertinent    Prior to Admission medications  Medication Sig Start Date End Date Taking? Authorizing Provider  LamoTRIgine  300 MG TB24 24 hour tablet Take 3 tablets by mouth daily. 12/26/24  Yes [provider]  lamoTRIgine  (LAMICTAL ) 200 MG tablet Take 2 tablets (400 mg total) by mouth 2 (two) times daily. 10/28/22   Krishnan, Gokul, MD  lamoTRIgine  (LAMICTAL ) 200 MG tablet Take 900 mg by mouth daily.    [provider]  phentermine 15 MG capsule Take 15 mg by mouth daily. 10/05/24   [provider]     Objective    Physical Exam: Vitals:   01/06/25 2015 01/06/25 2030 01/06/25 2045 01/06/25 2115  BP: 113/80 117/88 (!) 127/98 (!) 128/98  Pulse: 100 93 90 86  Resp: (!) 21 17 17 12   Temp:      TempSrc:      SpO2: 100% 100% 100% 100%  Weight:      Height:        General: appears to be stated age; somnolent Skin: warm, dry, no rash Head:  AT/[place] Mouth:  Oral mucosa membranes appear moist, normal dentition Neck: supple; trachea midline Heart:  RRR; did not appreciate any M/R/G Lungs: CTAB, did not appreciate any wheezes, rales, or rhonchi Abdomen: + BS; soft, ND, NT Vascular: 2+ pedal pulses b/l; 2+ radial pulses b/l Extremities: no peripheral edema, no muscle wasting          Labs on Admission: I have personally reviewed following labs and imaging studies  CBC: Recent Labs  Lab 01/06/25 1942  WBC 6.5  NEUTROABS 4.0  HGB 12.5  HCT 36.4  MCV 92.2  PLT 184   Basic Metabolic Panel: Recent Labs  Lab 01/06/25 1942  NA 136  K 3.3*  CL 100  CO2 24  GLUCOSE 138*  BUN <5*  CREATININE 0.66  CALCIUM 8.6*   GFR: Estimated Creatinine Clearance: 127.6 mL/min (by C-G formula based on SCr of 0.66 mg/dL). Liver Function Tests: Recent Labs   Lab 01/06/25 1942  AST 18  ALT 16  ALKPHOS 101  BILITOT 0.6  PROT 7.5  ALBUMIN 4.1   No results for input(s): LIPASE, AMYLASE in the last 168 hours. No results for input(s): AMMONIA in the last 168 hours. Coagulation Profile: Recent Labs  Lab 01/06/25 1942  INR 1.0   Cardiac Enzymes: No results for input(s): CKTOTAL, CKMB, CKMBINDEX, TROPONINI in the last 168 hours. BNP (last 3 results) No results for input(s): PROBNP in the last 8760 hours. HbA1C: No results for input(s): HGBA1C in the last 72 hours. CBG: No results for input(s): GLUCAP in the last 168 hours. Lipid Profile: No results for input(s): CHOL, HDL, LDLCALC, TRIG, CHOLHDL, LDLDIRECT in the last 72 hours. Thyroid  Function Tests: No results for input(s): TSH, T4TOTAL, FREET4, T3FREE, THYROIDAB in the last 72 hours. Anemia Panel: No  results for input(s): VITAMINB12, FOLATE, FERRITIN, TIBC, IRON, RETICCTPCT in the last 72 hours. Urine analysis:    Component Value Date/Time   COLORURINE YELLOW 02/01/2023 1814   APPEARANCEUR CLEAR 02/01/2023 1814   LABSPEC 1.023 02/01/2023 1814   PHURINE 8.0 02/01/2023 1814   GLUCOSEU NEGATIVE 02/01/2023 1814   HGBUR NEGATIVE 02/01/2023 1814   BILIRUBINUR NEGATIVE 02/01/2023 1814   KETONESUR NEGATIVE 02/01/2023 1814   PROTEINUR 30 (A) 02/01/2023 1814   UROBILINOGEN 0.2 08/03/2009 1242   NITRITE NEGATIVE 02/01/2023 1814   LEUKOCYTESUR NEGATIVE 02/01/2023 1814    Radiological Exams on Admission: CT ANGIO HEAD NECK W WO CM W PERF (CODE STROKE) Result Date: 01/06/2025 EXAM: CTA Head and Neck with Perfusion 01/06/2025 08:03:02 PM TECHNIQUE: CTA of the head and neck was performed with the administration of intravenous contrast. 3D postprocessing with multiplanar reconstructions and MIPs was performed to evaluate the vascular anatomy. Cerebral perfusion analysis using computed tomography with contrast administration, including  post-processing of parametric maps with determination of cerebral blood flow, cerebral blood volume, mean transit time and time-to-maximum. Automated exposure control, iterative reconstruction, and/or weight based adjustment of the mA/kV was utilized to reduce the radiation dose to as low as reasonably achievable. COMPARISON: None available CLINICAL HISTORY: Neuro deficit, acute, stroke suspected Neuro deficit, acute, stroke suspected FINDINGS: AORTIC ARCH AND ARCH VESSELS: No dissection or arterial injury. No significant stenosis of the brachiocephalic or subclavian arteries. CERVICAL CAROTID ARTERIES: No dissection, arterial injury, or hemodynamically significant stenosis by NASCET criteria. CERVICAL VERTEBRAL ARTERIES: No dissection, arterial injury, or significant stenosis. LUNGS AND MEDIASTINUM: Unremarkable. SOFT TISSUES: No acute abnormality. BONES: No acute abnormality. ANTERIOR CIRCULATION: No significant stenosis of the internal carotid arteries. No significant stenosis of the anterior cerebral arteries. No significant stenosis of the middle cerebral arteries. No aneurysm. POSTERIOR CIRCULATION: No significant stenosis of the posterior cerebral arteries. No significant stenosis of the basilar artery. No significant stenosis of the vertebral arteries. No aneurysm. OTHER: No dural venous sinus thrombosis on this non-dedicated study. EXAM QUALITY: Exam quality is adequate with diagnostic perfusion maps. No significant motion artifact. Appropriate arterial inflow and venous outflow curves. CORE INFARCT (CBF<30% volume): 0 mL TOTAL HYPOPERFUSION (Tmax>6s volume): 0 mL Mismatch volume: 0 mL Mismatch ratio: not applicable Location: not applicable IMPRESSION: 1. No large vessel occlusion. 2. No hemodynamically significant stenosis. 3. No evidence of penumbra or core infarct by CT perfusion. Electronically signed by: Gilmore Molt MD 01/06/2025 08:19 PM EST RP Workstation: [id number]   CT HEAD CODE STROKE WO  CONTRAST Result Date: 01/06/2025 EXAM: CT HEAD WITHOUT CONTRAST 01/06/2025 07:48:57 PM TECHNIQUE: CT of the head was performed without the administration of intravenous contrast. Automated exposure control, iterative reconstruction, and/or weight based adjustment of the mA/kV was utilized to reduce the radiation dose to as low as reasonably achievable. COMPARISON: None available. CLINICAL HISTORY: Neuro deficit, acute, stroke suspected FINDINGS: BRAIN AND VENTRICLES: No acute hemorrhage. No evidence of acute infarct. No hydrocephalus. No extra-axial collection. No mass effect or midline shift. ORBITS: No acute abnormality. SINUSES: No acute abnormality. SOFT TISSUES AND SKULL: No acute soft tissue abnormality. No skull fracture. Findings communicated via pager to Dr. Arora at 8:00 PM IMPRESSION: 1. No acute intracranial abnormality.  ASPECTS 10. Electronically signed by: Gilmore Molt MD 01/06/2025 08:00 PM EST RP Workstation: [id number]      Assessment/Plan   Principal Problem:   Stroke-like symptoms Active Problems:   Hypokalemia   Allergic rhinitis       #) Strokelike symptoms: Acute  onset of left-sided weakness left-sided 01/06/2025, with complete interval resolution of the symptoms and spontaneous fashion breakthrough seizure neurology consulting, and suspect which have been ordered and are currently pending.  Less likely acute ischemic stroke.  In the context of history of seizure disorder: Factors that increase patient's risk for breakthrough seizure, including recent reduction in dose of Lamictal  in October 2025 or recent introduction of phentermine weight loss diminished sleep, and increased stress at home.  Plan: Neurology formally consulted.  EEG in the morning.  MRI brain.  Start seizure precautions.  Fall precautions.  Repeat CMP, CBC ordered.  Next dose scheduled to occur at 10 AM.                        #) (hypokalemia: Presenting with serum potassium of  3.3.  Plan : Potassium and chloride 40 mill colons p.o. x 1 dose now.  Add on magnesium  level.  Monitor on telemetry.  CMP in the morning.                        #) Allergic Rhinitis: documented h/o such, on scheduled Allegra  as an outpatient in the absence of any outpatient use of scheduled intranasal corticosteroids.   Plan: cont home allergra. .          DVT prophylaxis: SCD's   Code Status: Full code Family Communication: I discussed pt's case with her mother, who was present at bedside;  Disposition Plan: Per Rounding Team Consults called: EDP d/w on-call neurology, Dr. Arora, who is formally consulting. Dr. Voncile suspects that the presentation is more likely on basis of a breakthrough seizure as opposed to an acute ischemic stroke. He recommends MRI brain, MRI cervical spine, EEG, lamictal  level, and did not recommend loading the patient with anti-epileptic at this time. ;  Admission status: obs     I SPENT GREATER THAN 75  MINUTES IN CLINICAL CARE TIME/MEDICAL DECISION-MAKING IN COMPLETING THIS ADMISSION.      Dempsey Knotek B Roark Rufo DO [facility]  From 7PM - 7AM   01/06/2025, 9:49 PM         [1]  Allergies Allergen Reactions   Apium Graveolens Var. Dulce Anaphylaxis   Rice Anaphylaxis   Strawberry (Diagnostic) Anaphylaxis   Pineapple Itching   Sudafed [Pseudoephedrine] Itching and Other (See Comments)    Dizziness  Weakness   Banana Itching   "

## 2025-01-06 NOTE — Code Documentation (Signed)
 Responded to Code Stroke called at 1931 for R sided weakness, headache, and vomiting, LSN-1100. Pt arrived at 1936, CBG-138, NIH-3, CT head negative, CTA/CTP-no LVO. TNK not given-pt outside window. Plan MRI brain/C-spine. Please complete VS/neuro checks(including NIH) q2h x 12h, then q4h.

## 2025-01-06 NOTE — ED Notes (Signed)
 Family at bedside.

## 2025-01-06 NOTE — ED Notes (Signed)
 Patient transported to MRI

## 2025-01-07 ENCOUNTER — Encounter (HOSPITAL_COMMUNITY): Payer: Self-pay | Admitting: Internal Medicine

## 2025-01-07 ENCOUNTER — Other Ambulatory Visit (HOSPITAL_COMMUNITY): Payer: Self-pay

## 2025-01-07 ENCOUNTER — Observation Stay (HOSPITAL_COMMUNITY)

## 2025-01-07 DIAGNOSIS — G43511 Persistent migraine aura without cerebral infarction, intractable, with status migrainosus: Secondary | ICD-10-CM

## 2025-01-07 DIAGNOSIS — J309 Allergic rhinitis, unspecified: Secondary | ICD-10-CM | POA: Diagnosis present

## 2025-01-07 DIAGNOSIS — R569 Unspecified convulsions: Secondary | ICD-10-CM

## 2025-01-07 DIAGNOSIS — R531 Weakness: Principal | ICD-10-CM

## 2025-01-07 LAB — CBC WITH DIFFERENTIAL/PLATELET
Abs Immature Granulocytes: 0.04 K/uL (ref 0.00–0.07)
Basophils Absolute: 0 K/uL (ref 0.0–0.1)
Basophils Relative: 0 %
Eosinophils Absolute: 0 K/uL (ref 0.0–0.5)
Eosinophils Relative: 0 %
HCT: 36.5 % (ref 36.0–46.0)
Hemoglobin: 12.3 g/dL (ref 12.0–15.0)
Immature Granulocytes: 1 %
Lymphocytes Relative: 34 %
Lymphs Abs: 2.8 K/uL (ref 0.7–4.0)
MCH: 31.4 pg (ref 26.0–34.0)
MCHC: 33.7 g/dL (ref 30.0–36.0)
MCV: 93.1 fL (ref 80.0–100.0)
Monocytes Absolute: 0.5 K/uL (ref 0.1–1.0)
Monocytes Relative: 6 %
Neutro Abs: 5 K/uL (ref 1.7–7.7)
Neutrophils Relative %: 59 %
Platelets: 190 K/uL (ref 150–400)
RBC: 3.92 MIL/uL (ref 3.87–5.11)
RDW: 14.6 % (ref 11.5–15.5)
WBC: 8.4 K/uL (ref 4.0–10.5)
nRBC: 0 % (ref 0.0–0.2)

## 2025-01-07 LAB — TSH: TSH: 0.889 u[IU]/mL (ref 0.350–4.500)

## 2025-01-07 LAB — COMPREHENSIVE METABOLIC PANEL WITH GFR
ALT: 15 U/L (ref 0–44)
AST: 18 U/L (ref 15–41)
Albumin: 3.9 g/dL (ref 3.5–5.0)
Alkaline Phosphatase: 92 U/L (ref 38–126)
Anion gap: 10 (ref 5–15)
BUN: <5 mg/dL — ABNORMAL LOW (ref 6–20)
CO2: 23 mmol/L (ref 22–32)
Calcium: 8.8 mg/dL — ABNORMAL LOW (ref 8.9–10.3)
Chloride: 101 mmol/L (ref 98–111)
Creatinine, Ser: 0.63 mg/dL (ref 0.44–1.00)
GFR, Estimated: >60 mL/min
Glucose, Bld: 89 mg/dL (ref 70–99)
Potassium: 3.7 mmol/L (ref 3.5–5.1)
Sodium: 135 mmol/L (ref 135–145)
Total Bilirubin: 0.6 mg/dL (ref 0.0–1.2)
Total Protein: 7.1 g/dL (ref 6.5–8.1)

## 2025-01-07 LAB — URINE DRUG SCREEN
Amphetamines: NEGATIVE
Barbiturates: NEGATIVE
Benzodiazepines: NEGATIVE
Cocaine: NEGATIVE
Fentanyl: NEGATIVE
Methadone Scn, Ur: NEGATIVE
Opiates: NEGATIVE
Tetrahydrocannabinol: NEGATIVE

## 2025-01-07 LAB — MAGNESIUM
Magnesium: 2.1 mg/dL (ref 1.7–2.4)
Magnesium: 2 mg/dL (ref 1.7–2.4)

## 2025-01-07 LAB — VITAMIN B12: Vitamin B-12: 629 pg/mL (ref 180–914)

## 2025-01-07 LAB — PREGNANCY, URINE: Preg Test, Ur: NEGATIVE — AB

## 2025-01-07 LAB — HCG, SERUM, QUALITATIVE: Preg, Serum: NEGATIVE — AB

## 2025-01-07 MED ORDER — LAMOTRIGINE ER 100 MG PO TB24
900.0000 mg | ORAL_TABLET | Freq: Every morning | ORAL | Status: DC
  Administered 2025-01-07 – 2025-01-08 (×2): 900 mg via ORAL
  Filled 2025-01-07 (×2): qty 9

## 2025-01-07 MED ORDER — SUMATRIPTAN SUCCINATE 6 MG/0.5ML ~~LOC~~ SOLN: 6.0000 mg | Freq: Once | SUBCUTANEOUS | Status: DC

## 2025-01-07 MED ORDER — KETOROLAC TROMETHAMINE 30 MG/ML IJ SOLN: 30.0000 mg | Freq: Four times a day (QID) | INTRAMUSCULAR | Status: DC | PRN

## 2025-01-07 MED ORDER — SUMATRIPTAN SUCCINATE 6 MG/0.5ML ~~LOC~~ SOLN
6.0000 mg | Freq: Once | SUBCUTANEOUS | Status: AC
  Administered 2025-01-07: 6 mg via SUBCUTANEOUS
  Filled 2025-01-07: qty 0.5

## 2025-01-07 MED ORDER — METOCLOPRAMIDE HCL 5 MG/ML IJ SOLN: 10.0000 mg | Freq: Four times a day (QID) | INTRAMUSCULAR | Status: DC

## 2025-01-07 MED ORDER — DIPHENHYDRAMINE HCL 50 MG/ML IJ SOLN: 25.0000 mg | Freq: Four times a day (QID) | INTRAMUSCULAR | Status: DC | PRN

## 2025-01-07 NOTE — ED Notes (Signed)
 CCMD called.

## 2025-01-07 NOTE — ED Notes (Signed)
 EEG at bedside.

## 2025-01-07 NOTE — Evaluation (Signed)
 Physical Therapy Evaluation Patient Details Name: Karina Nelson MRN: [id number] DOB: 05-07-1991 Today's Date: 01/07/2025  History of Present Illness  Pt is 34 yo presenting to [facility] on 1/7 due to sudden onset R sided weakness with difficulty walking, headache and nausea. PMH: Anemia, anhedonia, asthma, depression, epilepsy, migraines without aura, scoliosis  Clinical Impression  Pt currently Mod I for bed mobility with LE on and off the bed without physical assistance, Min A for sit to stand from stretcher with RW; no buckling noted at bil knees with unilateral UE Support and very light UE support without significant muscle contraction on the R in standing. Pt was asked to attempt marching in place and did not clear either foot. Pt was asked to perform knee extension in sitting with no notable movement; pt reporting she was attempting to move. Pt friend present and supportive states family can bump pt up steps in W/C at home. Due to pt current functional status, home set up and available assistance at home recommending skilled physical therapy services 3x/week in order to address strength, balance and functional mobility to decrease risk for falls, injury and re-hospitalization.           If plan is discharge home, recommend the following: A little help with walking and/or transfers;Assistance with cooking/housework;Assist for transportation;Help with stairs or ramp for entrance     Equipment Recommendations Wheelchair (measurements PT);Wheelchair cushion (measurements PT)     Functional Status Assessment Patient has had a recent decline in their functional status and demonstrates the ability to make significant improvements in function in a reasonable and predictable amount of time.     Precautions / Restrictions Precautions Precautions: Fall Recall of Precautions/Restrictions: Intact Restrictions Weight Bearing Restrictions Per Provider Order: No      Mobility  Bed Mobility Overal bed  mobility: Modified Independent        Transfers Overall transfer level: Needs assistance Equipment used: Rolling walker (2 wheels) Transfers: Sit to/from Stand Sit to Stand: From elevated surface, Min assist           General transfer comment: Initially pt reporting she was concerned about standing. Friend assisted with stabilizing RW. Pt was Min A for initial momentum with heavy encouragement from EOB. No buckling noted at bil knees. Pt reported she was holding herself up completly with her arms. Pt was asked to relax her shoulders and not contraction noted in RUE in standing while holding onto RW. Pt stated she needed to sit. Performed 2 more times with less and less assist. Pt stated she needed to vomit and used LUE to hold vomit bag, gagging into bag with no emesis noted with LUE completely off the RW. No buckling noted at bil knees.    Ambulation/Gait   General Gait Details: Pt attempted to wgt shift, did not clear floor with either foot.     Balance Overall balance assessment: Needs assistance Sitting-balance support: No upper extremity supported, Feet unsupported Sitting balance-Leahy Scale: Good     Standing balance support: Bilateral upper extremity supported, During functional activity Standing balance-Leahy Scale: Fair Standing balance comment: pt able to stand with unilateral UE support while quickly leaning forward multiple times to gag into emesis bag.       Pertinent Vitals/Pain Pain Assessment Pain Assessment: 0-10 Pain Score: 8  Pain Location: head Pain Descriptors / Indicators: Aching Pain Intervention(s): Limited activity within patient's tolerance, Monitored during session    Home Living Family/patient expects to be discharged to:: Private residence Living Arrangements:  Spouse/significant other;Children;Other (Comment) Available Help at Discharge: Family;Available 24 hours/day;Friend(s) Type of Home: House Home Access: Stairs to enter Entrance  Stairs-Rails: None Entrance Stairs-Number of Steps: 4 Alternate Level Stairs-Number of Steps: 15 Home Layout: Two level Home Equipment: BSC/3in1;Crutches      Prior Function Prior Level of Function : Independent/Modified Independent             Mobility Comments: normally ambulates without an AD ADLs Comments: 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     Extremity/Trunk Assessment   Upper Extremity Assessment Upper Extremity Assessment: Overall WFL for tasks assessed    Lower Extremity Assessment Lower Extremity Assessment:  (Difficult to assess. Pt was able to independenty bring LE to EOB, when asked to perform knee extension sitting EOB pt reported she was attempting to extend her knee but no movement was evident. Pt was able to stand 3x at EOB without buckling)    Cervical / Trunk Assessment Cervical / Trunk Assessment: Normal  Communication   Communication Communication: No apparent difficulties    Cognition Arousal: Alert Behavior During Therapy: WFL for tasks assessed/performed   PT - Cognitive impairments: No apparent impairments     Following commands: Intact       Cueing Cueing Techniques: Verbal cues     General Comments General comments (skin integrity, edema, etc.): friend present and supportive during session. Pt reporting dizziness and nausea in standing. BP 144/90        Assessment/Plan    PT Assessment Patient needs continued PT services  PT Problem List Decreased mobility       PT Treatment Interventions DME instruction;Therapeutic activities;Gait training;Therapeutic exercise;Stair training;Balance training;Functional mobility training;Patient/family education    PT Goals (Current goals can be found in the Care Plan section)  Acute Rehab PT Goals Patient Stated Goal: to return home and get mobile again PT Goal Formulation: With patient Time For Goal Achievement: 01/21/25 Potential to Achieve Goals: Good    Frequency Min 2X/week         AM-PAC PT 6 Clicks Mobility  Outcome Measure Help needed turning from your back to your side while in a flat bed without using bedrails?: None Help needed moving from lying on your back to sitting on the side of a flat bed without using bedrails?: None Help needed moving to and from a bed to a chair (including a wheelchair)?: A Little Help needed standing up from a chair using your arms (e.g., wheelchair or bedside chair)?: A Little Help needed to walk in hospital room?: A Little Help needed climbing 3-5 steps with a railing? : A Little 6 Click Score: 20    End of Session Equipment Utilized During Treatment: Gait belt Activity Tolerance: Patient tolerated treatment well Patient left: in bed;with family/visitor present (in hall of ED) Nurse Communication: Mobility status PT Visit Diagnosis: Other abnormalities of gait and mobility (R26.89)    Time: 8864-8796 PT Time Calculation (min) (ACUTE ONLY): 28 min   Charges:   PT Evaluation $PT Eval Low Complexity: 1 Low PT Treatments $Therapeutic Activity: 8-22 mins PT General Charges $$ ACUTE PT VISIT: 1 Visit         Dorothyann Maier, DPT, CLT  Acute Rehabilitation Services Office: [phone] (Secure chat preferred)   Dorothyann VEAR Maier 01/07/2025, 1:59 PM

## 2025-01-07 NOTE — ED Notes (Signed)
 Attempted to ambulate pt, pt was able to sit up on the side of the bed but expressed concern about standing even with a 2 person assist. Provider made aware.

## 2025-01-07 NOTE — ED Notes (Signed)
 CCMD notified for monitoring

## 2025-01-07 NOTE — Plan of Care (Signed)
 EEG is negative. MRI C-spine and brain is negative.  Continue Lamictal .    Can follow-up with outpatient neurology.  Neurology will sign off.

## 2025-01-07 NOTE — ED Notes (Signed)
 Provider at bedside and wants staff to ambulate pt to bathroom.

## 2025-01-07 NOTE — ED Notes (Signed)
Floor notified.

## 2025-01-07 NOTE — ED Notes (Signed)
"  Therapy at bedside.  "

## 2025-01-07 NOTE — Procedures (Signed)
 Patient Name: Girlie E Mentor  MRN: [id number]  Epilepsy Attending: Arlin MALVA Krebs  Referring Physician/Provider: Nichola Zeke HERO, MD  Date: 01/07/2025 Duration: 30.07 mins  Patient history: 35yo F with left-sided weakness. EEG to evaluate for seizure  Level of alertness: Awake, asleep  AEDs during EEG study: LTG  Technical aspects: This EEG study was done with scalp electrodes positioned according to the 10-20 International system of electrode placement. Electrical activity was reviewed with band pass filter of 1-70Hz , sensitivity of 7 uV/mm, display speed of 96mm/sec with a 60Hz  notched filter applied as appropriate. EEG data were recorded continuously and digitally stored.  Video monitoring was available and reviewed as appropriate.  Description: The posterior dominant rhythm consists of 8 Hz activity of moderate voltage (25-35 uV) seen predominantly in posterior head regions, symmetric and reactive to eye opening and eye closing. Sleep was characterized by vertex waves, sleep spindles (12 to 14 Hz), maximal frontocentral region.  Hyperventilation and photic stimulation were not performed.     IMPRESSION: This study is within normal limits. No seizures or epileptiform discharges were seen throughout the recording.  A normal interictal EEG does not exclude the diagnosis of epilepsy.   Trase Bunda O Khamiya Varin

## 2025-01-07 NOTE — Progress Notes (Signed)
 Routine EEG completed, results pending Neurology review and interpretation

## 2025-01-07 NOTE — Plan of Care (Signed)
  Problem: Education: Goal: Knowledge of General Education information will improve Description: Including pain rating scale, medication(s)/side effects and non-pharmacologic comfort measures Outcome: Progressing   Problem: Health Behavior/Discharge Planning: Goal: Ability to manage health-related needs will improve Outcome: Progressing   Problem: Clinical Measurements: Goal: Ability to maintain clinical measurements within normal limits will improve Outcome: Progressing Goal: Will remain free from infection Outcome: Progressing Goal: Diagnostic test results will improve Outcome: Progressing Goal: Respiratory complications will improve Outcome: Progressing Goal: Cardiovascular complication will be avoided Outcome: Progressing   Problem: Activity: Goal: Risk for activity intolerance will decrease Outcome: Progressing   Problem: Nutrition: Goal: Adequate nutrition will be maintained Outcome: Progressing   Problem: Coping: Goal: Level of anxiety will decrease Outcome: Progressing   Problem: Elimination: Goal: Will not experience complications related to bowel motility Outcome: Progressing Goal: Will not experience complications related to urinary retention Outcome: Progressing   Problem: Pain Managment: Goal: General experience of comfort will improve and/or be controlled Outcome: Progressing   Problem: Safety: Goal: Ability to remain free from injury will improve Outcome: Progressing   Problem: Skin Integrity: Goal: Risk for impaired skin integrity will decrease Outcome: Progressing   Problem: Education: Goal: Expressions of having a comfortable level of knowledge regarding the disease process will increase Outcome: Progressing   Problem: Coping: Goal: Ability to adjust to condition or change in health will improve Outcome: Progressing Goal: Ability to identify appropriate support needs will improve Outcome: Progressing   Problem: Health Behavior/Discharge  Planning: Goal: Compliance with prescribed medication regimen will improve Outcome: Progressing   Problem: Medication: Goal: Risk for medication side effects will decrease Outcome: Progressing

## 2025-01-07 NOTE — ED Notes (Signed)
 Pt used bedpan, able to lift her hips up and roll to each side of bed w/o assistance.

## 2025-01-07 NOTE — ED Notes (Signed)
 EEG called and said they will be down in approx 5-10 minutes

## 2025-01-07 NOTE — Progress Notes (Signed)
 " PROGRESS NOTE    Karina Nelson  FMW:[id number] DOB: 07-12-1991 DOA: 01/06/2025 PCP: [facility]  Subjective: She has a headache and photophobia. Some minor tingling  in her rt hand. She has a history of headaches ( CVA workup is negative). She received one dose of imitrex . She is currently sleeping with a blanket over her head in the ER. I will give her toradol  and reglan  ( if nausea).    Hospital Course: 01/07/25: Patient's symptoms are most consistent with a complicated migraine.  When I saw her in the emergency room this morning she had her face covered due to photophobia had a severe headache with some tingling in her right fingers.  She states she has a history of headaches but has never been formally diagnosed with migraine headaches.  Stroke workup was completely normal.  Trial of Imitrex , Toradol , Benadryl , and Reglan .  EEG is pending   Assessment and Plan:  Principal Problem:   Stroke-like symptoms Active Problems:   Hypokalemia   Allergic rhinitis   #) Strokelike symptoms: Acute onset of left-sided weakness left-sided 01/06/2025, with complete interval resolution of the symptoms and spontaneous fashion breakthrough seizure neurology consulting, and suspect which have been ordered and are currently pending.  Less likely acute ischemic stroke.  In the context of history of seizure disorder: Factors that increase patient's risk for breakthrough seizure, including recent reduction in dose of Lamictal  in October 2025 or recent introduction of phentermine weight loss diminished sleep, and increased stress at home.   Plan: Neurology formally consulted.  EEG in the morning.  MRI brain.  Start seizure precautions.  Fall precautions.  Repeat CMP, CBC ordered.  Next dose scheduled to occur at 10 AM.    #) (hypokalemia: Presenting with serum potassium of 3.3.   Plan : Potassium and chloride 40 eq p.o. x 1 dose now.  Add on magnesium  level.  Monitor on telemetry.  CMP in the  morning  #) Allergic Rhinitis: documented h/o such, on scheduled Allegra  as an outpatient in the absence of any outpatient use of scheduled intranasal corticosteroids.   Plan: cont home allergra. .   DVT prophylaxis: SCD's   Code Status: Full code Family Communication: No family available Disposition Plan: Per Rounding Team Consults called: EDP d/w on-call neurology, Dr. Voncile, who is formally consulting. Dr. Voncile suspects that the presentation is more likely on basis of a breakthrough seizure as opposed to an acute ischemic stroke. He recommends MRI brain, MRI cervical spine, EEG, lamictal  level, and did not recommend loading the patient with anti-epileptic at this time. ;  Admission status: obs   Objective: Vitals:   01/07/25 0922 01/07/25 0922 01/07/25 1230 01/07/25 1326  BP:  114/81 (!) 123/55   Pulse:  96 75   Resp:  17 13   Temp: 99 F (37.2 C)   98.8 F (37.1 C)  TempSrc: Oral   Oral  SpO2:  100% 100%   Weight:      Height:       No intake or output data in the 24 hours ending 01/07/25 1404 Filed Weights   01/06/25 2009  Weight: 122 kg    Examination:  Lying in the ER stretcher with her head covered with a blanket.  Physical Exam Constitutional:      Appearance: Normal appearance. She is obese.  HENT:     Head: Normocephalic and atraumatic.     Nose: Nose normal.  Cardiovascular:     Rate and  Rhythm: Normal rate and regular rhythm.     Heart sounds: No murmur heard.    No friction rub. No gallop.  Pulmonary:     Effort: Pulmonary effort is normal.     Breath sounds: Normal breath sounds. No wheezing, rhonchi or rales.  Abdominal:     General: Bowel sounds are normal.     Palpations: Abdomen is soft.  Skin:    General: Skin is warm and dry.  Neurological:     General: No focal deficit present.     Mental Status: She is alert and oriented to person, place, and time.  Psychiatric:        Mood and Affect: Mood normal.     Data Reviewed: I have  personally reviewed following labs and imaging studies  CBC: Recent Labs  Lab 01/06/25 1942 01/07/25 0159  WBC 6.5 8.4  NEUTROABS 4.0 5.0  HGB 12.5 12.3  HCT 36.4 36.5  MCV 92.2 93.1  PLT 184 190   Basic Metabolic Panel: Recent Labs  Lab 01/06/25 1942 01/07/25 0159 01/07/25 0832  NA 136 135  --   K 3.3* 3.7  --   CL 100 101  --   CO2 24 23  --   GLUCOSE 138* 89  --   BUN <5* <5*  --   CREATININE 0.66 0.63  --   CALCIUM 8.6* 8.8*  --   MG  --  2.1 2.0   GFR: Estimated Creatinine Clearance: 127.6 mL/min (by C-G formula based on SCr of 0.63 mg/dL). Liver Function Tests: Recent Labs  Lab 01/06/25 1942 01/07/25 0159  AST 18 18  ALT 16 15  ALKPHOS 101 92  BILITOT 0.6 0.6  PROT 7.5 7.1  ALBUMIN 4.1 3.9   No results for input(s): LIPASE, AMYLASE in the last 168 hours. No results for input(s): AMMONIA in the last 168 hours. Coagulation Profile: Recent Labs  Lab 01/06/25 1942  INR 1.0   Cardiac Enzymes: No results for input(s): CKTOTAL, CKMB, CKMBINDEX, TROPONINI in the last 168 hours. ProBNP, BNP (last 5 results) No results for input(s): PROBNP, BNP in the last 8760 hours. HbA1C: No results for input(s): HGBA1C in the last 72 hours. CBG: No results for input(s): GLUCAP in the last 168 hours. Lipid Profile: No results for input(s): CHOL, HDL, LDLCALC, TRIG, CHOLHDL, LDLDIRECT in the last 72 hours. Thyroid  Function Tests: No results for input(s): TSH, T4TOTAL, FREET4, T3FREE, THYROIDAB in the last 72 hours. Anemia Panel: No results for input(s): VITAMINB12, FOLATE, FERRITIN, TIBC, IRON, RETICCTPCT in the last 72 hours. Sepsis Labs: No results for input(s): PROCALCITON, LATICACIDVEN in the last 168 hours.  No results found for this or any previous visit (from the past 240 hours).   Radiology Studies: MR CERVICAL SPINE WO CONTRAST Result Date: 01/07/2025 EXAM: MRI Cervical Spine Without Contrast  01/06/2025 11:59:00 PM TECHNIQUE: Multiplanar multisequence MRI of the cervical spine was performed. COMPARISON: None available. CLINICAL HISTORY: Myelopathy, acute, cervical spine FINDINGS: BONES AND ALIGNMENT: Normal alignment. Normal vertebral body heights. Degenerative/discogenic signal changes at C4-C5, C5-C6, C6-C7. SPINAL CORD: Normal spinal cord size. No abnormal spinal cord signal. SOFT TISSUES: No paraspinal mass. C2-C3: No significant disc herniation. No spinal canal stenosis or neural foraminal narrowing. C3-C4: Small posterior disk osteophyte complex and mild uncovertebral hypertrophy. No spinal canal stenosis or neural foraminal narrowing. C4-C5: Small posterior disk osteophyte complex and uncovertebral hypertrophy. No spinal canal stenosis. Moderate right and mild left foraminal stenosis. C5-C6: Posterior disc osteophyte complex and mild right  uncovertebral hypertrophy. Mild canal stenosis with flattening of the ventral cord. Mild right foraminal stenosis. C6-C7: Small posterior disc osteophyte complex. No spinal canal stenosis or neural foraminal narrowing. C7-T1: No significant disc herniation. No spinal canal stenosis or neural foraminal narrowing. IMPRESSION: 1. Moderate right and mild left foraminal stenosis at C4-C5. 2. Mild canal stenosis at C5-C6. Electronically signed by: Gilmore Molt MD 01/07/2025 12:48 AM EST RP Workstation: [id number]   MR BRAIN WO CONTRAST Result Date: 01/07/2025 EXAM: MRI Brain Without Contrast 01/06/2025 11:58:00 PM TECHNIQUE: Multiplanar multisequence MRI of the head/brain was performed without the administration of intravenous contrast. COMPARISON: CT head today CLINICAL HISTORY: Neuro deficit, acute, stroke suspected FINDINGS: BRAIN AND VENTRICLES: No acute infarct. No intracranial hemorrhage. No mass. No midline shift. No hydrocephalus. The sella is unremarkable. Normal flow voids. ORBITS: No significant abnormality. SINUSES AND MASTOIDS: No significant  abnormality. BONES AND SOFT TISSUES: Normal marrow signal. No significant soft tissue abnormality. IMPRESSION: 1. No acute abnormality. Electronically signed by: Gilmore Molt MD 01/07/2025 12:34 AM EST RP Workstation: [id number]   CT ANGIO HEAD NECK W WO CM W PERF (CODE STROKE) Result Date: 01/06/2025 EXAM: CTA Head and Neck with Perfusion 01/06/2025 08:03:02 PM TECHNIQUE: CTA of the head and neck was performed with the administration of intravenous contrast. 3D postprocessing with multiplanar reconstructions and MIPs was performed to evaluate the vascular anatomy. Cerebral perfusion analysis using computed tomography with contrast administration, including post-processing of parametric maps with determination of cerebral blood flow, cerebral blood volume, mean transit time and time-to-maximum. Automated exposure control, iterative reconstruction, and/or weight based adjustment of the mA/kV was utilized to reduce the radiation dose to as low as reasonably achievable. COMPARISON: None available CLINICAL HISTORY: Neuro deficit, acute, stroke suspected Neuro deficit, acute, stroke suspected FINDINGS: AORTIC ARCH AND ARCH VESSELS: No dissection or arterial injury. No significant stenosis of the brachiocephalic or subclavian arteries. CERVICAL CAROTID ARTERIES: No dissection, arterial injury, or hemodynamically significant stenosis by NASCET criteria. CERVICAL VERTEBRAL ARTERIES: No dissection, arterial injury, or significant stenosis. LUNGS AND MEDIASTINUM: Unremarkable. SOFT TISSUES: No acute abnormality. BONES: No acute abnormality. ANTERIOR CIRCULATION: No significant stenosis of the internal carotid arteries. No significant stenosis of the anterior cerebral arteries. No significant stenosis of the middle cerebral arteries. No aneurysm. POSTERIOR CIRCULATION: No significant stenosis of the posterior cerebral arteries. No significant stenosis of the basilar artery. No significant stenosis of the vertebral  arteries. No aneurysm. OTHER: No dural venous sinus thrombosis on this non-dedicated study. EXAM QUALITY: Exam quality is adequate with diagnostic perfusion maps. No significant motion artifact. Appropriate arterial inflow and venous outflow curves. CORE INFARCT (CBF<30% volume): 0 mL TOTAL HYPOPERFUSION (Tmax>6s volume): 0 mL Mismatch volume: 0 mL Mismatch ratio: not applicable Location: not applicable IMPRESSION: 1. No large vessel occlusion. 2. No hemodynamically significant stenosis. 3. No evidence of penumbra or core infarct by CT perfusion. Electronically signed by: Gilmore Molt MD 01/06/2025 08:19 PM EST RP Workstation: [id number]   CT HEAD CODE STROKE WO CONTRAST Result Date: 01/06/2025 EXAM: CT HEAD WITHOUT CONTRAST 01/06/2025 07:48:57 PM TECHNIQUE: CT of the head was performed without the administration of intravenous contrast. Automated exposure control, iterative reconstruction, and/or weight based adjustment of the mA/kV was utilized to reduce the radiation dose to as low as reasonably achievable. COMPARISON: None available. CLINICAL HISTORY: Neuro deficit, acute, stroke suspected FINDINGS: BRAIN AND VENTRICLES: No acute hemorrhage. No evidence of acute infarct. No hydrocephalus. No extra-axial collection. No mass effect or midline shift. ORBITS: No acute abnormality. SINUSES: No  acute abnormality. SOFT TISSUES AND SKULL: No acute soft tissue abnormality. No skull fracture. Findings communicated via pager to Dr. Arora at 8:00 PM IMPRESSION: 1. No acute intracranial abnormality.  ASPECTS 10. Electronically signed by: Gilmore Molt MD 01/06/2025 08:00 PM EST RP Workstation: [id number]    Scheduled Meds:  lamoTRIgine   900 mg Oral q morning   potassium chloride   40 mEq Oral Once   sodium chloride  flush  3 mL Intravenous Once   Continuous Infusions:   LOS: 0 days   Time spent: 35 minutes  Lonni KANDICE Moose, MD  [facility]  01/07/2025, 2:04 PM   "

## 2025-01-08 ENCOUNTER — Other Ambulatory Visit (HOSPITAL_COMMUNITY): Payer: Self-pay

## 2025-01-08 LAB — LAMOTRIGINE LEVEL: Lamotrigine Lvl: 24.2 ug/mL — ABNORMAL HIGH (ref 2.0–20.0)

## 2025-01-08 MED ORDER — SUMATRIPTAN SUCCINATE 100 MG PO TABS
100.0000 mg | ORAL_TABLET | Freq: Once | ORAL | 2 refills | Status: AC | PRN
  Filled 2025-01-08: qty 9, 20d supply, fill #0

## 2025-01-08 NOTE — Plan of Care (Signed)
 Patient was seen in the hospital for migrainous headache and stroke like symptoms. Possible breakthrough seizure with post ictal weakness which resolved and she was back to baseline and discharged today. Lamictal  level was ordered on arrival and reported in my queue today - level is 24. She has been discharged with advise to continue her lamictal  as prescribed by her neurologist. She will need outpatient follow up as advised upon discharge.  Eligio Lav, MD On-call inpatient neurology at [facility]

## 2025-01-08 NOTE — Discharge Summary (Signed)
 " Physician Discharge Summary   Patient: Karina Nelson MRN: [id number] DOB: 1991-09-28  Admit date:     01/06/2025  Discharge date: 01/08/2025  Discharge Physician: Lonni KANDICE Moose   PCP: [facility] [place], [place].   Recommendations at discharge:   Follow-up with PCP in 2 weeks. Stay hydrated, if you feel a headache coming on take Imitrex  or insurance covered equivalent.  Take a nap if possible.  If headaches are persisting see neurology you may be eligible for preventative medicine.  Discharge Diagnoses: Principal Problem:   Intractable persistent migraine aura without cerebral infarction and with status migrainosus Active Problems:   Hypokalemia   Stroke-like symptoms   Allergic rhinitis   Left-sided weakness  Resolved Problems:   * No resolved hospital problems. [facility] Course:  Patient was admitted with headache photophobia left-sided weakness.  Stroke workup was negative.  EEG was negative.  She was treated with Imitrex  Toradol  Reglan  Benadryl  with improvement.  Tells me she does not have headaches frequently so I do not think she requires a preventative medication such as Topamax.  Did talk to her about using Imitrex  when she feels a migraine coming on to help abort it.  Also to drink fluids try to rest.  If she is having frequent migraines she needs to see her PCP and/or neurology consider preventative medication.  At the time of discharge she was afebrile hemodynamically stable clinically improved.  He had some residual discomfort walking and would benefit from a walker.  Potassium was supplemented during this admission.  Principal Problem:   Stroke-like symptoms Active Problems:   Hypokalemia   Allergic rhinitis   #) Strokelike symptoms: After investigation her symptoms are most consistent with an intractable persistent migraine without stroke with status migrainosus resolved. acute onset of left-sided weakness left-sided 01/06/2025, with complete interval  resolution of the symptoms and spontaneous fashion breakthrough seizure neurology consulting, and suspect which have been ordered and are currently pending.  Less likely acute ischemic stroke.  In the context of history of seizure disorder: Factors that increase patient's risk for breakthrough seizure, including recent reduction in dose of Lamictal  in October 2025 or recent introduction of phentermine weight loss diminished sleep, and increased stress at home.   Plan: Neurology formally consulted.  EEG in the morning.  MRI brain.  Start seizure precautions.  Fall precautions.  Repeat CMP, CBC ordered.  Next dose scheduled to occur at 10 AM.    #) (hypokalemia: Presenting with serum potassium of 3.3.   Plan : Potassium and chloride 40 eq p.o. x 1 dose now.  Add on magnesium  level.  Monitor on telemetry.  CMP in the morning   #) Allergic Rhinitis: documented h/o such, on scheduled Allegra  as an outpatient in the absence of any outpatient use of scheduled intranasal corticosteroids.   Plan: cont home allergra. Assessment and Plan:        Consultants: [facility] Procedures performed: Head CT without contrast, CTA head and neck, MRI brain without contrast, EEG Disposition: Home Diet recommendation:  Regular diet DISCHARGE MEDICATION: Allergies as of 01/08/2025       Reactions   Apium Graveolens Var. Dulce Anaphylaxis   CELERY   Other Anaphylaxis, Itching, Swelling   GRAPES   Rice Anaphylaxis   Strawberry (diagnostic) Anaphylaxis   Pineapple Itching   Sudafed [pseudoephedrine] Itching, Other (See Comments)   Dizziness  Weakness   Banana Itching        Medication List     TAKE  these medications    Acetaminophen  8 Hour 650 MG CR tablet Generic drug: acetaminophen  Take 1,300 mg by mouth every 8 (eight) hours as needed for pain.   EPINEPHrine 0.3 mg/0.3 mL Soaj injection Commonly known as: EPI-PEN Inject 0.3 mg into the muscle as needed for anaphylaxis.    fexofenadine  180 MG tablet Commonly known as: ALLEGRA  Take 180 mg by mouth daily before supper.   ibuprofen  200 MG tablet Commonly known as: ADVIL  Take 400 mg by mouth every 6 (six) hours as needed for mild pain (pain score 1-3).   LamoTRIgine  300 MG Tb24 24 hour tablet Take 3 tablets by mouth every morning.   LINIMENTS EX Apply 1 Application topically daily as needed (Muscle and joint pain). ABSORBINE HORSE LINIMENT GEL   MENTHOL -CAMPHOR EX Apply 1 Application topically daily as needed (Muscle and arthritis pain). TOMMIE COPPER COOLING MENTHOL  AND CAMPHOR 3.5-3.5  WITH CUCUMBER AND PEPPERMINT   Mirena (52 MG) 20 MCG/DAY Iud Generic drug: levonorgestrel 1 each by Intrauterine route once.   phentermine 30 MG capsule Take 30 mg by mouth every morning.   Restasis 0.05 % ophthalmic emulsion Generic drug: cycloSPORINE Place 1 drop into both eyes 2 (two) times daily.   SUMAtriptan  100 MG tablet Commonly known as: IMITREX  Take 1 tablet (100 mg total) by mouth once as needed for migraine. May repeat in 2 hours if headache persists or recurs.   Systane Balance 0.6 % Soln Generic drug: Propylene Glycol Place 2 drops into both eyes as needed (Dry eyes).               Durable Medical Equipment  (From admission, onward)           Start     Ordered   01/08/25 1235  For home use only DME standard manual wheelchair with seat cushion  Once       Comments: Patient suffers from weakness which impairs their ability to perform daily activities like bathing, dressing, and grooming in the home.  A walker will not resolve issue with performing activities of daily living. A wheelchair will allow patient to safely perform daily activities. Patient can safely propel the wheelchair in the home or has a caregiver who can provide assistance. Length of need 6 months . Accessories: elevating leg rests (ELRs), wheel locks, extensions and anti-tippers.   01/08/25 1234   01/08/25 1234  For  home use only DME Bedside commode  Once       Comments: Drop arm  Question:  Patient needs a bedside commode to treat with the following condition  Answer:  Weakness   01/08/25 1234            Follow-up Information     [facility]. Schedule an appointment as soon as possible for a visit.   Specialty: Rehabilitation Contact information: [address] [place]  [address] [phone]               Discharge Exam: Fredricka Weights   01/06/25 2009 01/07/25 1555  Weight: 122 kg 120.8 kg   Awake, alert, photophobia resolved No scleral icterus Normal work of breathing, clear lungs Regular rate and rhythm no murmurs Abdomen is soft Extremities perfused Neurologic back to baseline.    Condition at discharge: stable  The results of significant diagnostics from this hospitalization (including imaging, microbiology, ancillary and laboratory) are listed below for reference.   Imaging Studies: EEG adult Result Date: 01/07/2025 Yadav, Priyanka O, MD  01/07/2025  5:29 PM Patient Name: Karina Nelson MRN: [id number] Epilepsy Attending: Arlin MALVA Krebs Referring Physician/Provider: Nichola Zeke HERO, MD Date: 01/07/2025 Duration: 30.07 mins Patient history: 34yo F with left-sided weakness. EEG to evaluate for seizure Level of alertness: Awake, asleep AEDs during EEG study: LTG Technical aspects: This EEG study was done with scalp electrodes positioned according to the 10-20 International system of electrode placement. Electrical activity was reviewed with band pass filter of 1-70Hz , sensitivity of 7 uV/mm, display speed of 43mm/sec with a 60Hz  notched filter applied as appropriate. EEG data were recorded continuously and digitally stored.  Video monitoring was available and reviewed as appropriate. Description: The posterior dominant rhythm consists of 8 Hz activity of moderate voltage (25-35 uV) seen predominantly in posterior head regions,  symmetric and reactive to eye opening and eye closing. Sleep was characterized by vertex waves, sleep spindles (12 to 14 Hz), maximal frontocentral region.  Hyperventilation and photic stimulation were not performed.   IMPRESSION: This study is within normal limits. No seizures or epileptiform discharges were seen throughout the recording. A normal interictal EEG does not exclude the diagnosis of epilepsy. Arlin MALVA Krebs   MR CERVICAL SPINE WO CONTRAST Result Date: 01/07/2025 EXAM: MRI Cervical Spine Without Contrast 01/06/2025 11:59:00 PM TECHNIQUE: Multiplanar multisequence MRI of the cervical spine was performed. COMPARISON: None available. CLINICAL HISTORY: Myelopathy, acute, cervical spine FINDINGS: BONES AND ALIGNMENT: Normal alignment. Normal vertebral body heights. Degenerative/discogenic signal changes at C4-C5, C5-C6, C6-C7. SPINAL CORD: Normal spinal cord size. No abnormal spinal cord signal. SOFT TISSUES: No paraspinal mass. C2-C3: No significant disc herniation. No spinal canal stenosis or neural foraminal narrowing. C3-C4: Small posterior disk osteophyte complex and mild uncovertebral hypertrophy. No spinal canal stenosis or neural foraminal narrowing. C4-C5: Small posterior disk osteophyte complex and uncovertebral hypertrophy. No spinal canal stenosis. Moderate right and mild left foraminal stenosis. C5-C6: Posterior disc osteophyte complex and mild right uncovertebral hypertrophy. Mild canal stenosis with flattening of the ventral cord. Mild right foraminal stenosis. C6-C7: Small posterior disc osteophyte complex. No spinal canal stenosis or neural foraminal narrowing. C7-T1: No significant disc herniation. No spinal canal stenosis or neural foraminal narrowing. IMPRESSION: 1. Moderate right and mild left foraminal stenosis at C4-C5. 2. Mild canal stenosis at C5-C6. Electronically signed by: Gilmore Molt MD 01/07/2025 12:48 AM EST RP Workstation: [id number]   MR BRAIN WO CONTRAST Result  Date: 01/07/2025 EXAM: MRI Brain Without Contrast 01/06/2025 11:58:00 PM TECHNIQUE: Multiplanar multisequence MRI of the head/brain was performed without the administration of intravenous contrast. COMPARISON: CT head today CLINICAL HISTORY: Neuro deficit, acute, stroke suspected FINDINGS: BRAIN AND VENTRICLES: No acute infarct. No intracranial hemorrhage. No mass. No midline shift. No hydrocephalus. The sella is unremarkable. Normal flow voids. ORBITS: No significant abnormality. SINUSES AND MASTOIDS: No significant abnormality. BONES AND SOFT TISSUES: Normal marrow signal. No significant soft tissue abnormality. IMPRESSION: 1. No acute abnormality. Electronically signed by: Gilmore Molt MD 01/07/2025 12:34 AM EST RP Workstation: [id number]   CT ANGIO HEAD NECK W WO CM W PERF (CODE STROKE) Result Date: 01/06/2025 EXAM: CTA Head and Neck with Perfusion 01/06/2025 08:03:02 PM TECHNIQUE: CTA of the head and neck was performed with the administration of intravenous contrast. 3D postprocessing with multiplanar reconstructions and MIPs was performed to evaluate the vascular anatomy. Cerebral perfusion analysis using computed tomography with contrast administration, including post-processing of parametric maps with determination of cerebral blood flow, cerebral blood volume, mean transit time and time-to-maximum. Automated exposure control, iterative reconstruction, and/or weight based  adjustment of the mA/kV was utilized to reduce the radiation dose to as low as reasonably achievable. COMPARISON: None available CLINICAL HISTORY: Neuro deficit, acute, stroke suspected Neuro deficit, acute, stroke suspected FINDINGS: AORTIC ARCH AND ARCH VESSELS: No dissection or arterial injury. No significant stenosis of the brachiocephalic or subclavian arteries. CERVICAL CAROTID ARTERIES: No dissection, arterial injury, or hemodynamically significant stenosis by NASCET criteria. CERVICAL VERTEBRAL ARTERIES: No dissection, arterial  injury, or significant stenosis. LUNGS AND MEDIASTINUM: Unremarkable. SOFT TISSUES: No acute abnormality. BONES: No acute abnormality. ANTERIOR CIRCULATION: No significant stenosis of the internal carotid arteries. No significant stenosis of the anterior cerebral arteries. No significant stenosis of the middle cerebral arteries. No aneurysm. POSTERIOR CIRCULATION: No significant stenosis of the posterior cerebral arteries. No significant stenosis of the basilar artery. No significant stenosis of the vertebral arteries. No aneurysm. OTHER: No dural venous sinus thrombosis on this non-dedicated study. EXAM QUALITY: Exam quality is adequate with diagnostic perfusion maps. No significant motion artifact. Appropriate arterial inflow and venous outflow curves. CORE INFARCT (CBF<30% volume): 0 mL TOTAL HYPOPERFUSION (Tmax>6s volume): 0 mL Mismatch volume: 0 mL Mismatch ratio: not applicable Location: not applicable IMPRESSION: 1. No large vessel occlusion. 2. No hemodynamically significant stenosis. 3. No evidence of penumbra or core infarct by CT perfusion. Electronically signed by: Gilmore Molt MD 01/06/2025 08:19 PM EST RP Workstation: [id number]   CT HEAD CODE STROKE WO CONTRAST Result Date: 01/06/2025 EXAM: CT HEAD WITHOUT CONTRAST 01/06/2025 07:48:57 PM TECHNIQUE: CT of the head was performed without the administration of intravenous contrast. Automated exposure control, iterative reconstruction, and/or weight based adjustment of the mA/kV was utilized to reduce the radiation dose to as low as reasonably achievable. COMPARISON: None available. CLINICAL HISTORY: Neuro deficit, acute, stroke suspected FINDINGS: BRAIN AND VENTRICLES: No acute hemorrhage. No evidence of acute infarct. No hydrocephalus. No extra-axial collection. No mass effect or midline shift. ORBITS: No acute abnormality. SINUSES: No acute abnormality. SOFT TISSUES AND SKULL: No acute soft tissue abnormality. No skull fracture. Findings  communicated via pager to Dr. Arora at 8:00 PM IMPRESSION: 1. No acute intracranial abnormality.  ASPECTS 10. Electronically signed by: Gilmore Molt MD 01/06/2025 08:00 PM EST RP Workstation: [id number]    Microbiology: Results for orders placed or performed in visit on 04/24/23  OB RESULT CONSOLE Group B Strep     Status: None   Collection Time: 04/15/23 12:00 AM  Result Value Ref Range Status   GBS Negative  Final    Labs: CBC: Recent Labs  Lab 01/06/25 1942 01/07/25 0159  WBC 6.5 8.4  NEUTROABS 4.0 5.0  HGB 12.5 12.3  HCT 36.4 36.5  MCV 92.2 93.1  PLT 184 190   Basic Metabolic Panel: Recent Labs  Lab 01/06/25 1942 01/07/25 0159 01/07/25 0832  NA 136 135  --   K 3.3* 3.7  --   CL 100 101  --   CO2 24 23  --   GLUCOSE 138* 89  --   BUN <5* <5*  --   CREATININE 0.66 0.63  --   CALCIUM 8.6* 8.8*  --   MG  --  2.1 2.0   Liver Function Tests: Recent Labs  Lab 01/06/25 1942 01/07/25 0159  AST 18 18  ALT 16 15  ALKPHOS 101 92  BILITOT 0.6 0.6  PROT 7.5 7.1  ALBUMIN 4.1 3.9   CBG: No results for input(s): GLUCAP in the last 168 hours.  Discharge time spent: greater than 30 minutes.  Signed: Lonni KANDICE Moose,  MD [facility] 01/08/2025 "

## 2025-01-08 NOTE — Progress Notes (Signed)
 Physical Therapy Treatment Patient Details Name: Karina Nelson MRN: [id number] DOB: 26-Mar-1991 Today's Date: 01/08/2025   History of Present Illness Pt is 34 yo presenting [facility] on 1[facility] due to sudden onset R sided weakness with difficulty walking, headache and nausea. MRI C-spine and brain is negative. CT (-) LVO and acute intracranial abnormality. EEG showed no seizures or epileptiform discharges. PMH: Anemia, anhedonia, asthma, depression, epilepsy, migraines without aura, scoliosis    PT Comments  Pt greeted supine in bed, pleasant and agreeable to PT session. She engaged in OOB mobility, transferring to/from the recliner chair using RW with minA. Pt took increased time and effort to mobilize ~33ft. She continues to demonstrate inability to lift either foot in standing. Pt alternated between pivoting and sliding each LE across the ground. Moderate cues for sequencing and heavy dependence on BUE support on RW. Once seated in recliner chair pt showed she was actively able to lift and kick BLE ~0.5 off the floor. She is highly motivated to participate in therapy and regain her PLOF. Will continue to follow acutely and advance appropriately.    If plan is discharge home, recommend the following: A little help with walking and/or transfers;Assistance with cooking/housework;Assist for transportation;Help with stairs or ramp for entrance;A little help with bathing/dressing/bathroom   Can travel by private vehicle        Equipment Recommendations  Wheelchair (measurements PT);Wheelchair cushion (measurements PT);BSC/3in1;Rolling walker (2 wheels)    Recommendations for Other Services       Precautions / Restrictions Precautions Precautions: Fall Recall of Precautions/Restrictions: Intact Restrictions Weight Bearing Restrictions Per Provider Order: No     Mobility  Bed Mobility Overal bed mobility: Modified Independent             General bed mobility comments: HOB elevated. Use of bed  rail.    Transfers Overall transfer level: Needs assistance Equipment used: Rolling walker (2 wheels) Transfers: Sit to/from Stand, Bed to chair/wheelchair/BSC Sit to Stand: Min assist   Step pivot transfers: Min assist       General transfer comment: Pt stood from lowest bed height. Cued proper hand placement using RW. Educated pt on use of momentum to power up. MinA to stand. Transferred to recliner chair on left and then returned to bed on right with increased time and effort. Cues for sequencing. Fair eccentric control, instructed pt to reach back for surface before sitting.    Ambulation/Gait Ambulation/Gait assistance: Min assist Gait Distance (Feet): 2 Feet Assistive device: Rolling walker (2 wheels) Gait Pattern/deviations: Shuffle, Decreased dorsiflexion - right, Decreased dorsiflexion - left, Trunk flexed Gait velocity: decr   Pre-gait activities: Pt engaged in standing weight shifts with BUE support on RW. She attempted to complete heel and toe raises, getting minimal clearence from floor. Pt was unable to lift foot off the ground in attempts to march. General Gait Details: Pt transferred to recliner chair by alternating between pivoting and/or slowly wiggling each foot or alternating between dragging each LE across the ground. Heavy reliance on BUE support on RW to offload legs. Pt reports less difficulty controlling RLE>LLE. No buckling noted at either knee. Pt demonstrated a fwd lean and would frequently hold her breath. Cues for PLB technique and upright neutral posture. Pt took significantly increased time. She did better we instructed to visual the movement prior to attempting the motion. Pt c/o nausea during transfer, no dry heaving or gagging this date.   Stairs  Wheelchair Mobility     Tilt Bed    Modified Rankin (Stroke Patients Only)       Balance Overall balance assessment: Needs assistance Sitting-balance support: No upper extremity  supported, Feet unsupported Sitting balance-Leahy Scale: Good Sitting balance - Comments: Sat EOB or on edge of recliner   Standing balance support: Bilateral upper extremity supported, During functional activity, Reliant on assistive device for balance Standing balance-Leahy Scale: Fair Standing balance comment: Pt dependent on RW to transfer. She intermittently let go of AD and maintained static stance with CGA-minA.                            Communication Communication Communication: No apparent difficulties  Cognition Arousal: Alert Behavior During Therapy: WFL for tasks assessed/performed   PT - Cognitive impairments: No apparent impairments                       PT - Cognition Comments: Pt A,Ox4 Following commands: Intact      Cueing Cueing Techniques: Verbal cues  Exercises General Exercises - Lower Extremity Long Arc Quad: Seated, Both, AAROM, 10 reps (Pt was able to slightly kick foot forward barely off the ground on her own) Heel Slides: Supine, AROM, Both, 5 reps Hip ABduction/ADduction: Supine, AROM, Both, 5 reps Hip Flexion/Marching: Seated, Both, AAROM, 10 reps (Pt was able to lift foot ~0.5 off the ground on her own)    General Comments General comments (skin integrity, edema, etc.): Pt tachycardic with HR 118-130bpm with activity.      Pertinent Vitals/Pain Pain Assessment Pain Assessment: No/denies pain    Home Living Family/patient expects to be discharged to:: Private residence (Physical therapy at home after discharge and equipment) Living Arrangements: Spouse/significant other;Children;Parent                      Prior Function            PT Goals (current goals can now be found in the care plan section) Acute Rehab PT Goals Patient Stated Goal: Regain strength and mobility PT Goal Formulation: With patient Time For Goal Achievement: 01/21/25 Potential to Achieve Goals: Good Progress towards PT goals: Progressing  toward goals    Frequency    Min 2X/week      PT Plan      Co-evaluation              AM-PAC PT 6 Clicks Mobility   Outcome Measure  Help needed turning from your back to your side while in a flat bed without using bedrails?: None Help needed moving from lying on your back to sitting on the side of a flat bed without using bedrails?: None Help needed moving to and from a bed to a chair (including a wheelchair)?: A Little Help needed standing up from a chair using your arms (e.g., wheelchair or bedside chair)?: A Little Help needed to walk in hospital room?: A Lot Help needed climbing 3-5 steps with a railing? : Total 6 Click Score: 17    End of Session Equipment Utilized During Treatment: Other (comment) (Pt declined wearing gait belt reporting she felt like it made her nauseouos last time, re-educated pt on purpose of equipment and she continued to decline) Activity Tolerance: Patient tolerated treatment well Patient left: in bed;with call bell/phone within reach;with bed alarm set;with family/visitor present;Other (comment) (in chair position) Nurse Communication: Mobility status PT Visit Diagnosis: Other abnormalities  of gait and mobility (R26.89)     Time: 8875-8845 PT Time Calculation (min) (ACUTE ONLY): 30 min  Charges:    $Therapeutic Activity: 23-37 mins PT General Charges $$ ACUTE PT VISIT: 1 Visit                     Randall SAUNDERS, PT, DPT Acute Rehabilitation Services Office: [phone] Secure Chat Preferred  Delon CHRISTELLA Callander 01/08/2025, 1:46 PM

## 2025-01-08 NOTE — TOC Transition Note (Signed)
 Transition of Care [facility]) - Discharge Note   Patient Details  Name: Karina Nelson MRN: [id number] Date of Birth: [birth year]  Transition of Care [facility]) CM/SW Contact:  Andrez JULIANNA George, RN Phone Number: 01/08/2025, 12:48 PM   Clinical Narrative:     Pt is discharging home with outpatient therapy through [facility]. Information on the AVS.  Wheelchair and [facility] ordered for home and will be delivered to the room per [employer].  Pt has transportation home.  Final next level of care: OP Rehab Barriers to Discharge: No Barriers Identified   Patient Goals and CMS Choice     Choice offered to / list presented to : Patient      Discharge Placement                       Discharge Plan and Services Additional resources added to the After Visit Summary for                  DME Arranged: Bedside commode, Wheelchair manual DME Agency: [employer] Date DME Agency Contacted: 01/08/25   Representative spoke with at DME Agency: [place]            Social Drivers of Health (SDOH) Interventions SDOH Screenings   Food Insecurity: No Food Insecurity (01/08/2025)  Housing: Low Risk (01/08/2025)  Transportation Needs: No Transportation Needs (01/08/2025)  Utilities: Not At Risk (01/08/2025)  Depression (PHQ2-9): High Risk (11/09/2024)  Social Connections: Unknown (01/08/2025)  Tobacco Use: Medium Risk (01/07/2025)     Readmission Risk Interventions     No data to display

## 2025-01-08 NOTE — Progress Notes (Signed)
" °  °  Durable Medical Equipment  (From admission, onward)           Start     Ordered   01/08/25 1235  For home use only DME standard manual wheelchair with seat cushion  Once       Comments: Patient suffers from weakness which impairs their ability to perform daily activities like bathing, dressing, and grooming in the home.  A walker will not resolve issue with performing activities of daily living. A wheelchair will allow patient to safely perform daily activities. Patient can safely propel the wheelchair in the home or has a caregiver who can provide assistance. Length of need 6 months . Accessories: elevating leg rests (ELRs), wheel locks, extensions and anti-tippers.   01/08/25 1234   01/08/25 1234  For home use only DME Bedside commode  Once       Comments: Drop arm  Question:  Patient needs a bedside commode to treat with the following condition  Answer:  Weakness   01/08/25 1234            "

## 2025-01-08 NOTE — Progress Notes (Signed)
 Communicated with the patient via Caregility. Patient appears comfortable in the bed. AVS paperwork given to the patient. Discharge instructions discussed. Patients question answered to satisfaction. Patient agreeable with discharge plan.
